# Patient Record
Sex: Female | Born: 1950
Health system: Southern US, Community
[De-identification: ages and names within clinical notes are randomized; demographics above are authoritative.]

## PROBLEM LIST (undated history)

## (undated) DIAGNOSIS — Z87440 Personal history of urinary (tract) infections: Secondary | ICD-10-CM

## (undated) DIAGNOSIS — E213 Hyperparathyroidism, unspecified: Secondary | ICD-10-CM

## (undated) DIAGNOSIS — Z9289 Personal history of other medical treatment: Secondary | ICD-10-CM

## (undated) DIAGNOSIS — R519 Headache, unspecified: Secondary | ICD-10-CM

## (undated) DIAGNOSIS — Z803 Family history of malignant neoplasm of breast: Secondary | ICD-10-CM

## (undated) DIAGNOSIS — R51 Headache: Secondary | ICD-10-CM

## (undated) HISTORY — PX: ABDOMINAL HYSTERECTOMY: SHX81

## (undated) HISTORY — DX: Family history of malignant neoplasm of breast: Z80.3

## (undated) HISTORY — DX: Personal history of other medical treatment: Z92.89

## (undated) HISTORY — PX: APPENDECTOMY: SHX54

## (undated) HISTORY — DX: Hyperparathyroidism, unspecified: E21.3

## (undated) HISTORY — DX: Personal history of urinary (tract) infections: Z87.440

## (undated) HISTORY — DX: Headache, unspecified: R51.9

## (undated) HISTORY — PX: PARATHYROID EXPLORATION: SHX732

## (undated) HISTORY — DX: Headache: R51

---

## 1999-09-18 ENCOUNTER — Other Ambulatory Visit: Admission: RE | Admit: 1999-09-18 | Discharge: 1999-09-18 | Payer: Self-pay | Admitting: Obstetrics and Gynecology

## 2000-09-12 ENCOUNTER — Other Ambulatory Visit: Admission: RE | Admit: 2000-09-12 | Discharge: 2000-09-12 | Payer: Self-pay | Admitting: Obstetrics and Gynecology

## 2008-06-13 ENCOUNTER — Ambulatory Visit: Payer: Self-pay | Admitting: Genetic Counselor

## 2008-09-13 ENCOUNTER — Encounter: Admission: RE | Admit: 2008-09-13 | Discharge: 2008-09-13 | Payer: Self-pay | Admitting: Family Medicine

## 2009-09-18 ENCOUNTER — Encounter: Admission: RE | Admit: 2009-09-18 | Discharge: 2009-09-18 | Payer: Self-pay | Admitting: Family Medicine

## 2011-02-04 LAB — HM COLONOSCOPY

## 2014-08-17 DIAGNOSIS — H2513 Age-related nuclear cataract, bilateral: Secondary | ICD-10-CM | POA: Diagnosis not present

## 2014-09-14 DIAGNOSIS — M25532 Pain in left wrist: Secondary | ICD-10-CM | POA: Diagnosis not present

## 2014-09-14 DIAGNOSIS — M65842 Other synovitis and tenosynovitis, left hand: Secondary | ICD-10-CM | POA: Diagnosis not present

## 2014-09-28 DIAGNOSIS — M25532 Pain in left wrist: Secondary | ICD-10-CM | POA: Diagnosis not present

## 2014-09-28 DIAGNOSIS — M654 Radial styloid tenosynovitis [de Quervain]: Secondary | ICD-10-CM | POA: Diagnosis not present

## 2014-11-03 DIAGNOSIS — M19032 Primary osteoarthritis, left wrist: Secondary | ICD-10-CM | POA: Diagnosis not present

## 2014-11-03 DIAGNOSIS — M25532 Pain in left wrist: Secondary | ICD-10-CM | POA: Diagnosis not present

## 2014-11-24 DIAGNOSIS — M19032 Primary osteoarthritis, left wrist: Secondary | ICD-10-CM | POA: Diagnosis not present

## 2014-11-24 DIAGNOSIS — M25532 Pain in left wrist: Secondary | ICD-10-CM | POA: Diagnosis not present

## 2015-01-11 DIAGNOSIS — Z01419 Encounter for gynecological examination (general) (routine) without abnormal findings: Secondary | ICD-10-CM | POA: Diagnosis not present

## 2015-01-11 DIAGNOSIS — Z1231 Encounter for screening mammogram for malignant neoplasm of breast: Secondary | ICD-10-CM | POA: Diagnosis not present

## 2015-08-10 ENCOUNTER — Encounter: Payer: Self-pay | Admitting: Family Medicine

## 2015-08-10 ENCOUNTER — Ambulatory Visit (INDEPENDENT_AMBULATORY_CARE_PROVIDER_SITE_OTHER): Payer: Medicare Other | Admitting: Family Medicine

## 2015-08-10 VITALS — BP 100/58 | HR 86 | Temp 98.6°F | Ht 65.75 in | Wt 126.5 lb

## 2015-08-10 DIAGNOSIS — E559 Vitamin D deficiency, unspecified: Secondary | ICD-10-CM

## 2015-08-10 DIAGNOSIS — Z0000 Encounter for general adult medical examination without abnormal findings: Secondary | ICD-10-CM | POA: Diagnosis not present

## 2015-08-10 DIAGNOSIS — E785 Hyperlipidemia, unspecified: Secondary | ICD-10-CM | POA: Insufficient documentation

## 2015-08-10 LAB — COMPLETE METABOLIC PANEL WITH GFR
ALBUMIN: 4.5 g/dL (ref 3.6–5.1)
ALK PHOS: 46 U/L (ref 33–130)
ALT: 13 U/L (ref 6–29)
AST: 17 U/L (ref 10–35)
BUN: 14 mg/dL (ref 7–25)
CALCIUM: 9.3 mg/dL (ref 8.6–10.4)
CHLORIDE: 105 mmol/L (ref 98–110)
CO2: 25 mmol/L (ref 20–31)
Creat: 0.73 mg/dL (ref 0.50–0.99)
GFR, EST NON AFRICAN AMERICAN: 85 mL/min (ref >=60)
GFR, Est African American: >89 mL/min (ref >=60)
Glucose, Bld: 88 mg/dL (ref 65–99)
POTASSIUM: 4 mmol/L (ref 3.5–5.3)
SODIUM: 140 mmol/L (ref 135–146)
Total Bilirubin: 0.7 mg/dL (ref 0.2–1.2)
Total Protein: 6.8 g/dL (ref 6.1–8.1)

## 2015-08-10 LAB — LIPID PANEL
CHOL/HDL RATIO: 3
CHOLESTEROL: 218 mg/dL — AB (ref 0–200)
HDL: 65.9 mg/dL (ref >39.00)
LDL CALC: 132 mg/dL — AB (ref 0–99)
NonHDL: 152.27
TRIGLYCERIDES: 103 mg/dL (ref 0.0–149.0)
VLDL: 20.6 mg/dL (ref 0.0–40.0)

## 2015-08-10 LAB — VITAMIN D 25 HYDROXY (VIT D DEFICIENCY, FRACTURES): VITD: 67.37 ng/mL (ref 30.00–100.00)

## 2015-08-10 NOTE — Progress Notes (Signed)
HPI:  Andrea Gardner is here to establish care. She has not had a primary care doctor as has been seeing her gynecologist. Lives in [place] now and sees Dr. Garnet Koyanagi in gyn in [place], [place] yearly. History of complete hysterectomy for endometriosis and fibroids. She takes a number of supplements.  Has the following chronic problems that require follow up and concerns today:  Hyperlipidemia: -mild in the past -no medications in the past -regular aerobic exercise - only meat is fish and venison, healthy diet  History of Hyperparathyroidism: -dx in 2013 after kidney stone -s/p surgical treatment -done fine since and released from endocrine care -denies: pain, illness or stones since treatment  Vit Def: -takes vit D3 5000 IU  ROS negative for unless reported above: fevers, unintentional weight loss, hearing or vision loss, chest pain, palpitations, struggling to breath, hemoptysis, melena, hematochezia, hematuria, falls, loc, si, thoughts of self harm  Past Medical History  Diagnosis Date  . Frequent headaches   . History of blood transfusion   . History of UTI   . Hyperparathyroidism [facility])     s/p surgical treatment in 2013  . Family history of breast cancer     she reports she had genetic testing due to [place] and that was all negative    Past Surgical History  Procedure Laterality Date  . Abdominal hysterectomy      complete for endometriosis and fibroids  . Parathyroid exploration    . Appendectomy      w/ hysterectomy    Family History  Problem Relation Age of Onset  . Arthritis Father   . Breast cancer Mother   . Breast cancer Sister   . Breast cancer Maternal Aunt   . Hyperlipidemia Father   . Li-Fraumeni syndrome Sister   . Hypertension Father   . Heart disease Father     Social History   Social History  . Marital Status: Single    Spouse Name: N/A  . Number of Children: N/A  . Years of Education: N/A   Social History Main Topics  .  Smoking status: Former Research scientist (life sciences)  . Smokeless tobacco: None  . Alcohol Use: None     Comment: 1 glass of wine daily  . Drug Use: None  . Sexual Activity: Not Asked   Other Topics Concern  . None   Social History Narrative   Work or School: retired Forensic psychologist Situation: lives with husband      Spiritual Beliefs: Christian      Lifestyle: regular exercise; healthy diet           Current outpatient prescriptions:  .  Ascorbic Acid (VITAMIN C) 1000 MG tablet, Take 1,000 mg by mouth daily., Disp: , Rfl:  .  Biotin 5000 MCG CAPS, Take by mouth daily., Disp: , Rfl:  .  Calcium-Phosphorus-Vitamin D (CITRACAL +D3 PO), Take 630 mg by mouth daily., Disp: , Rfl:  .  Cholecalciferol (VITAMIN D-3 PO), Take 5,000 Units by mouth daily., Disp: , Rfl:  .  magnesium oxide (MAG-OX) 400 MG tablet, Take 400 mg by mouth 2 (two) times daily., Disp: , Rfl:  .  NON FORMULARY, Nutra-therm, Disp: , Rfl:  .  NON FORMULARY, Stimulant free fat burner, Disp: , Rfl:  .  Nutritional Supplements (ESTROVEN PO), Take by mouth., Disp: , Rfl:  .  Probiotic Product (FORTIFY DAILY PROBIOTIC PO), Take by mouth., Disp: , Rfl:   EXAM:  Filed Vitals:   08/10/15  1308  BP: 100/58  Pulse: 86  Temp: 98.6 F (37 C)    Body mass index is 20.57 kg/(m^2).  GENERAL: vitals reviewed and listed above, alert, oriented, appears well hydrated and in no acute distress  HEENT: atraumatic, conjunttiva clear, no obvious abnormalities on inspection of external nose and ears  NECK: no obvious masses on inspection  LUNGS: clear to auscultation bilaterally, no wheezes, rales or rhonchi, good air movement  CV: HRRR, no peripheral edema  MS: moves all extremities without noticeable abnormality  PSYCH: pleasant and cooperative, no obvious depression or anxiety  ASSESSMENT AND PLAN:  Discussed the following assessment and plan:  Visit for preventive health examination - Plan: CMP with eGFR  Hyperlipemia - Plan:  Lipid panel  Vitamin D deficiency - Plan: Vitamin D, 25-hydroxy  -We reviewed the PMH, PSH, FH, SH, Meds and Allergies. -We provided refills for any medications we will prescribe as needed. -We addressed current concerns per orders and patient instructions. -We have asked for records for pertinent exams, studies, vaccines and notes from previous providers. -We have advised patient to follow up per instructions below. -labs per orders -Discussed risks of supplements and advised checking calcium and vitamin D and only taking vitamins as needed -Advised assistant to contact cancer center to try to obtain office visit notes from prior genetic testing and counseling   -Patient advised to return or notify a doctor immediately if symptoms worsen or persist or new concerns arise.  Patient Instructions  BEFORE YOU LEAVE: -labs -schedule physical in 6 months  We recommend the following healthy lifestyle measures: - eat a healthy whole foods diet consisting of regular small meals composed of vegetables, fruits, beans, nuts, seeds, healthy meats such as white chicken and fish and whole grains.  - avoid sweets, white starchy foods, fried foods, fast food, processed foods, sodas, red meet and other fattening foods.  - get a least 150-300 minutes of aerobic exercise per week.   -We have ordered labs or studies at this visit. It can take up to 1-2 weeks for results and processing. We will contact you with instructions IF your results are abnormal. Normal results will be released to your [facility]. If you have not heard from [place] or can not find your results in [facility] in 2 weeks please contact our office.            Colin Benton R.

## 2015-08-10 NOTE — Progress Notes (Signed)
Pre visit review using our clinic review tool, if applicable. No additional management support is needed unless otherwise documented below in the visit note. 

## 2015-08-10 NOTE — Patient Instructions (Signed)
BEFORE YOU LEAVE: -labs -schedule physical in 6 months  We recommend the following healthy lifestyle measures: - eat a healthy whole foods diet consisting of regular small meals composed of vegetables, fruits, beans, nuts, seeds, healthy meats such as white chicken and fish and whole grains.  - avoid sweets, white starchy foods, fried foods, fast food, processed foods, sodas, red meet and other fattening foods.  - get a least 150-300 minutes of aerobic exercise per week.   -We have ordered labs or studies at this visit. It can take up to 1-2 weeks for results and processing. We will contact you with instructions IF your results are abnormal. Normal results will be released to your [facility]. If you have not heard from us or can not find your results in [facility] in 2 weeks please contact our office.

## 2015-11-20 ENCOUNTER — Encounter: Payer: Self-pay | Admitting: Family Medicine

## 2015-12-26 DIAGNOSIS — B308 Other viral conjunctivitis: Secondary | ICD-10-CM | POA: Diagnosis not present

## 2016-02-06 ENCOUNTER — Ambulatory Visit (INDEPENDENT_AMBULATORY_CARE_PROVIDER_SITE_OTHER): Payer: Medicare Other | Admitting: Family Medicine

## 2016-02-06 ENCOUNTER — Encounter: Payer: Self-pay | Admitting: Family Medicine

## 2016-02-06 VITALS — BP 110/78 | HR 74 | Temp 98.0°F | Ht 65.75 in | Wt 121.2 lb

## 2016-02-06 DIAGNOSIS — Z0000 Encounter for general adult medical examination without abnormal findings: Secondary | ICD-10-CM

## 2016-02-06 DIAGNOSIS — E785 Hyperlipidemia, unspecified: Secondary | ICD-10-CM | POA: Diagnosis not present

## 2016-02-06 NOTE — Progress Notes (Signed)
Medicare Annual Preventive Care Visit  (initial annual wellness or annual wellness exam)  Concerns and/or follow up today:  Andrea Gardner is here for her preventive care visit. She lives in [place] now and sees Dr. Leontine Locket in gyn in [place], [place] yearly. History of complete hysterectomy for endometriosis and fibroids. She takes a number of supplements. She has decided that moving forward she will no longer see gyn.   Has the following chronic problems that require follow up and concerns/chronic issues to follow up on today:  Hyperlipidemia: -mild in the past -no medications in the past -regular aerobic exercise - only meat is fish and venison, healthy diet - has increased exercise since the last visit  History of Hyperparathyroidism: -dx in 2013 after kidney stone -s/p surgical treatment -done fine since and released from endocrine care -denies: pain, illness or stones since treatment  Vit Def: -takes vit D3 5000 IU  Depression screen - she reported occ depressed mood to nurse - to me she said not depressed - just sometimes doesn't like the weather - happy, no anhedonia, no crying, no irritability or any other signs/symptoms depression. No SI or thoughts self harm.  ROS: negative for report of fevers, unintentional weight loss, vision changes, vision loss, hearing loss or change, chest pain, sob, hemoptysis, melena, hematochezia, hematuria, genital discharge or lesions, falls, bleeding or bruising, loc, thoughts of suicide or self harm, memory loss  1.) Patient-completed health risk assessment  - completed and reviewed, see scanned documentation  2.) Review of Medical History: -PMH, PSH, Family History and current specialty and care providers reviewed and updated and listed below  - see scanned in document in chart and below  Past Medical History:  Diagnosis Date  . Family history of breast cancer    FH Burgess Amor; her genetic testing was negative for this mutation  .  Frequent headaches   . History of blood transfusion   . History of UTI   . Hyperparathyroidism [facility])    s/p surgical treatment in 2013    Past Surgical History:  Procedure Laterality Date  . ABDOMINAL HYSTERECTOMY     complete for endometriosis and fibroids  . APPENDECTOMY     w/ hysterectomy  . PARATHYROID EXPLORATION      Social History   Social History  . Marital status: Married    Spouse name: N/A  . Number of children: N/A  . Years of education: N/A   Occupational History  . Not on file.   Social History Main Topics  . Smoking status: Former Games developer  . Smokeless tobacco: Not on file  . Alcohol use Not on file     Comment: 1 glass of wine daily  . Drug use: Unknown  . Sexual activity: Not on file   Other Topics Concern  . Not on file   Social History Narrative   Work or School: retired [employer] Situation: lives with husband      Spiritual Beliefs: Christian      Lifestyle: regular exercise; healthy diet          Family History  Problem Relation Age of Onset  . Arthritis Father   . Breast cancer Mother   . Breast cancer Sister   . Breast cancer Maternal Aunt   . Hyperlipidemia Father   . Li-Fraumeni syndrome Sister   . Hypertension Father   . Heart disease Father     Current Outpatient Prescriptions on File Prior to Visit  Medication  Sig Dispense Refill  . Ascorbic Acid (VITAMIN C) 1000 MG tablet Take 1,000 mg by mouth daily.    . Calcium-Phosphorus-Vitamin D (CITRACAL +D3 PO) Take 250 mg by mouth daily.     . Cholecalciferol (VITAMIN D-3 PO) Take 5,000 Units by mouth daily.    . magnesium oxide (MAG-OX) 400 MG tablet Take 400 mg by mouth 2 (two) times daily.    . NON FORMULARY Stimulant free fat burner    . Nutritional Supplements (ESTROVEN PO) Take by mouth.    . Probiotic Product (FORTIFY DAILY PROBIOTIC PO) Take by mouth.     No current facility-administered medications on file prior to visit.      3.) Review of functional  ability and level of safety:  Any difficulty hearing?  NO  History of falling?  NO  Any trouble with IADLs - using a phone, using transportation, grocery shopping, preparing meals, doing housework, doing laundry, taking medications and managing money? NO  Advance Directives? NO, declined discussion or assistance with this as she plans to do with a lawyer with her husband  See summary of recommendations in Patient Instructions below.  4.) Physical Exam Vitals:   02/06/16 0807  BP: 110/78  Pulse: 74  Temp: 98 F (36.7 C)   Estimated body mass index is 19.71 kg/m as calculated from the following:   Height as of this encounter: 5' 5.75" (1.67 m).   Weight as of this encounter: 121 lb 3.2 oz (55 kg).  EKG (optional): deferred  General: alert, appear well hydrated and in no acute distress  HEENT: visual acuity grossly intact  CV: HRRR  Lungs: CTA bilaterally  Psych: pleasant and cooperative, no obvious depression or anxiety  Cognitive function grossly intact  See patient instructions for recommendations.  Education and counseling regarding the above review of health provided with a plan for the following: -see scanned patient completed form for further details -fall prevention strategies discussed  -healthy lifestyle discussed -importance and resources for completing advanced directives discussed -see patient instructions below for any other recommendations provided  4)The following written screening schedule of preventive measures were reviewed with assessment and plan made per below, orders and patient instructions:         Alcohol screening done     Obesity Screening and counseling done     STI screening (Hep C if born [id number]) offered and per pt wishes declined     Tobacco Screening done        Pneumococcal (PPSV23 -one dose after 64, one before if risk factors), influenza yearly and hepatitis B vaccines (if high risk - end stage renal disease, IV drugs,  homosexual men, live in home for mentally retarded, hemophilia receiving factors) ASSESSMENT/PLAN: refuses all vaccines      Screening mammograph (yearly if >40) ASSESSMENT/PLAN: advised, she agrees to call to schedule herself at the breast center      Screening Pap smear/pelvic exam (q2 years) ASSESSMENT/PLAN: n/a, declined      Prostate cancer screening ASSESSMENT/PLAN: n/a, declined      Colorectal cancer screening (FOBT yearly or flex sig q4y or colonoscopy q10y or barium enema q4y) ASSESSMENT/PLAN: utd -advised assistant to obtain copy for our records      Diabetes outpatient self-management training services ASSESSMENT/PLAN: utd or done      Bone mass measurements(covered q2y if indicated - estrogen def, osteoporosis, hyperparathyroid, vertebral abnormalities, osteoporosis or steroids) ASSESSMENT/PLAN: discussed and advised, pt refused, she took fosomax for osteopenia briefly in the past and  has decided she will not take any medications for this regardless of degree - she is taking vit D and is doing wt bearing exercises. Refuses repeat dexa at this time.      Screening for glaucoma(q1y if high risk - diabetes, FH, AA and > 50 or hispanic and > 65) ASSESSMENT/PLAN: utd or advised      Medical nutritional therapy for individuals with diabetes or renal disease ASSESSMENT/PLAN: n/a      Cardiovascular screening blood tests (lipids q5y) ASSESSMENT/PLAN: done      Diabetes screening tests ASSESSMENT/PLAN: done   7.) Summary: -risk factors and conditions per above assessment were discussed and treatment, recommendations and referrals were offered per documentation above and orders and patient instructions.  Medicare annual wellness visit, subsequent  Hyperlipidemia, unspecified hyperlipidemia type  Patient Instructions  BEFORE YOU LEAVE: -follow up: Medicare wellness visit in 1 year -obtain colonoscopy report (did at Dr. Aleene Davidson office in [place] in 2012 , doc now  retired, [phone])  Please call today to schedule your mammogram at the breast center.  Please see a lawyer and/or go to this website to help you with advanced directives and designating a health care power of attorney so that your wishes will be followed should you become too ill to make your own medical decisions. [email or web address]   We recommend the following healthy lifestyle for LIFE: 1) Small portions.   Tip: eat off of a salad plate instead of a dinner plate.  Tip: It is ok to feel hungry after a meal - that likely means you ate an appropriate portion.  Tip: if you need more or a snack choose fruits, veggies and/or a handful of nuts or seeds.  2) Eat a healthy clean diet.  * Tip: Avoid (less then 1 serving per week): processed foods, sweets, sweetened drinks, white starches (rice, flour, bread, potatoes, pasta, etc), red meat, fast foods, butter  *Tip: CHOOSE instead   * 5-9 servings per day of fresh or frozen fruits and vegetables (but not corn, potatoes, bananas, canned or dried fruit)   *nuts and seeds, beans   *olives and olive oil   *small portions of lean meats such as fish and white chicken    *small portions of whole grains  3)Get at least 150 minutes of sweaty aerobic exercise per week.  4)Reduce stress - consider counseling, meditation and relaxation to balance other aspects of your life.      Kriste Basque R., DO

## 2016-02-06 NOTE — Patient Instructions (Signed)
BEFORE YOU LEAVE: -follow up: Medicare wellness visit in 1 year -obtain colonoscopy report (did at [place]. [place]ene [place] in [place] in 2012 , doc now retired, [phone])  Please call today to schedule your mammogram at the breast center.  Please see a lawyer and/or go to this website to help you with advanced directives and designating a health care power of attorney so that your wishes will be followed should you become too ill to make your own medical decisions. AffordableReports.glHttp://www.ncdhhs.gov/aging/direct.htm   We recommend the following healthy lifestyle for LIFE: 1) Small portions.   Tip: eat off of a salad plate instead of a dinner plate.  Tip: It is ok to feel hungry after a meal - that likely means you ate an appropriate portion.  Tip: if you need more or a snack choose fruits, veggies and/or a handful of nuts or seeds.  2) Eat a healthy clean diet.  * Tip: Avoid (less then 1 serving per week): processed foods, sweets, sweetened drinks, white starches (rice, flour, bread, potatoes, pasta, etc), red meat, fast foods, butter  *Tip: CHOOSE instead   * 5-9 servings per day of fresh or frozen fruits and vegetables (but not corn, potatoes, bananas, canned or dried fruit)   *nuts and seeds, beans   *olives and olive oil   *small portions of lean meats such as fish and white chicken    *small portions of whole grains  3)Get at least 150 minutes of sweaty aerobic exercise per week.  4)Reduce stress - consider counseling, meditation and relaxation to balance other aspects of your life.

## 2016-02-06 NOTE — Progress Notes (Signed)
Pre visit review using our clinic review tool, if applicable. No additional management support is needed unless otherwise documented below in the visit note. 

## 2016-02-12 ENCOUNTER — Other Ambulatory Visit: Payer: Self-pay | Admitting: Family Medicine

## 2016-02-12 ENCOUNTER — Telehealth: Payer: Self-pay | Admitting: Family Medicine

## 2016-02-12 DIAGNOSIS — Z1231 Encounter for screening mammogram for malignant neoplasm of breast: Secondary | ICD-10-CM

## 2016-02-12 MED ORDER — ESTRADIOL 0.05 MG/24HR TD PTWK: 0.0500 mg | MEDICATED_PATCH | TRANSDERMAL | 11 refills | Status: DC

## 2016-02-12 NOTE — Telephone Encounter (Signed)
Pt request refill  estradiol (CLIMARA - DOSED IN MG/24 HR) 0.05 mg/24hr patch  [facility]  Pt seen 10/10 and thought this rx was refilled then.

## 2016-02-12 NOTE — Telephone Encounter (Signed)
Rx done. 

## 2016-03-01 ENCOUNTER — Ambulatory Visit
Admission: RE | Admit: 2016-03-01 | Discharge: 2016-03-01 | Disposition: A | Discharge status: Home / Self Care | Payer: Medicare Other | Source: Ambulatory Visit | Attending: Family Medicine | Admitting: Family Medicine

## 2016-03-01 DIAGNOSIS — Z1231 Encounter for screening mammogram for malignant neoplasm of breast: Secondary | ICD-10-CM | POA: Diagnosis not present

## 2016-03-06 ENCOUNTER — Other Ambulatory Visit: Payer: Self-pay | Admitting: Family Medicine

## 2016-03-06 DIAGNOSIS — R928 Other abnormal and inconclusive findings on diagnostic imaging of breast: Secondary | ICD-10-CM

## 2016-03-12 ENCOUNTER — Ambulatory Visit
Admission: RE | Admit: 2016-03-12 | Discharge: 2016-03-12 | Disposition: A | Discharge status: Home / Self Care | Payer: Medicare Other | Source: Ambulatory Visit | Attending: Family Medicine | Admitting: Family Medicine

## 2016-03-12 DIAGNOSIS — R928 Other abnormal and inconclusive findings on diagnostic imaging of breast: Secondary | ICD-10-CM

## 2016-03-12 DIAGNOSIS — N6311 Unspecified lump in the right breast, upper outer quadrant: Secondary | ICD-10-CM | POA: Diagnosis not present

## 2016-03-12 DIAGNOSIS — R922 Inconclusive mammogram: Secondary | ICD-10-CM | POA: Diagnosis not present

## 2016-07-15 ENCOUNTER — Encounter: Payer: Self-pay | Admitting: Family Medicine

## 2016-07-15 ENCOUNTER — Ambulatory Visit (INDEPENDENT_AMBULATORY_CARE_PROVIDER_SITE_OTHER): Payer: Medicare Other | Admitting: Family Medicine

## 2016-07-15 VITALS — BP 122/80 | HR 80 | Temp 98.3°F | Ht 65.75 in | Wt 126.9 lb

## 2016-07-15 DIAGNOSIS — M79604 Pain in right leg: Secondary | ICD-10-CM | POA: Diagnosis not present

## 2016-07-15 NOTE — Progress Notes (Signed)
Pre visit review using our clinic review tool, if applicable. No additional management support is needed unless otherwise documented below in the visit note. 

## 2016-07-15 NOTE — Progress Notes (Signed)
HPI:  Acute visit for:  R medial thigh pain: -intermittent for maybe 1-2 months -feels worse with sitting for a long time, better with walking -no bad enough to taking anything for it or limiting activities -she is worried about a blood clot -no weakness, numbness, malaise, known injury, SOB, swelling or redness -her massage therapist told her it was muscular and she felt better after a massage  ROS: See pertinent positives and negatives per HPI.  Past Medical History:  Diagnosis Date  . Family history of breast cancer    FH Burgess Amor; her genetic testing was negative for this mutation  . Frequent headaches   . History of blood transfusion   . History of UTI   . Hyperparathyroidism [facility])    s/p surgical treatment in 2013    Past Surgical History:  Procedure Laterality Date  . ABDOMINAL HYSTERECTOMY     complete for endometriosis and fibroids  . APPENDECTOMY     w/ hysterectomy  . PARATHYROID EXPLORATION      Family History  Problem Relation Age of Onset  . Arthritis Father   . Breast cancer Mother   . Breast cancer Sister   . Breast cancer Maternal Aunt   . Hyperlipidemia Father   . Li-Fraumeni syndrome Sister   . Hypertension Father   . Heart disease Father     Social History   Social History  . Marital status: Married    Spouse name: N/A  . Number of children: N/A  . Years of education: N/A   Social History Main Topics  . Smoking status: Former Games developer  . Smokeless tobacco: Never Used  . Alcohol use None     Comment: 1 glass of wine daily  . Drug use: Unknown  . Sexual activity: Not Asked   Other Topics Concern  . 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  Ascorbic Acid (VITAMIN C) 1000 MG tablet, Take 1,000 mg by mouth daily., Disp: , Rfl:   .  BIOTIN PO, Take 100,000 mg by mouth daily., Disp: , Rfl:  .  Calcium-Phosphorus-Vitamin D (CITRACAL +D3 PO), Take 250 mg by mouth daily. , Disp: , Rfl:  .  Cholecalciferol (VITAMIN D-3 PO), Take 5,000 Units by mouth daily., Disp: , Rfl:  .  estradiol (CLIMARA - DOSED IN MG/24 HR) 0.05 mg/24hr patch, Place 1 patch (0.05 mg total) onto the skin once a week., Disp: 4 patch, Rfl: 11 .  magnesium oxide (MAG-OX) 400 MG tablet, Take 400 mg by mouth 2 (two) times daily., Disp: , Rfl:  .  NON FORMULARY, Stimulant free fat burner, Disp: , Rfl:  .  Nutritional Supplements (ESTROVEN PO), Take by mouth., Disp: , Rfl:  .  OVER THE COUNTER MEDICATION, Supplement with vitamin B6, B12 and folate, Disp: , Rfl:  .  Probiotic Product (FORTIFY DAILY PROBIOTIC PO), Take by mouth., Disp: , Rfl:  .  Probiotic Product (PROBIOTIC PO), Take by mouth., Disp: , Rfl:   EXAM:  Vitals:   07/15/16 0953  BP: 122/80  Pulse: 80  Temp: 98.3 F (36.8 C)    Body mass index is 20.64 kg/m.  GENERAL: vitals reviewed and listed above, alert, oriented, appears well hydrated and in  no acute distress  HEENT: atraumatic, conjunttiva clear, no obvious abnormalities on inspection of external nose and ears  NECK: no obvious masses on inspection  CV: no signs swelling, redness or edema of LE, no TTP of superficial or deep veins of legs  MS: moves all extremities without noticeable abnormality, normal gait, no redness, swelling or abnormal veins, mild TTP leg adductor tendons -reproduces symptoms, normal strength, sensitivity to light touch throughout in LE bilat, NV intact distal  PSYCH: pleasant and cooperative, no obvious depression or anxiety  ASSESSMENT AND PLAN:  Discussed the following assessment and plan:  Pain of right lower extremity  -we discussed possible serious and likely etiologies, workup and treatment, treatment risks and return precautions - suspect tendinopathy vs radicular symptoms most likely -after  this discussion, Drinda Buttsnnette opted for HEP, exercise, monitoring and follow up if needed -of course, we advised Drinda Buttsnnette  to return or notify a doctor immediately if symptoms worsen or persist or new concerns arise.   Patient Instructions  BEFORE YOU LEAVE: -let adductor exercises and low back exercises -follow up: ensure has annual exam with Dr. Selena BattenKim in October of 2018  Regular exercise.  Do the exercise programs provided several days per week.  I hope you are feeling better soon! Follow up if worsening, new concerns or you are not improving with treatment.      Kriste BasqueKIM, Dezmon Conover R., DO

## 2016-07-15 NOTE — Patient Instructions (Addendum)
BEFORE YOU LEAVE: -let adductor exercises and low back exercises -follow up: ensure has annual exam with Dr. Selena BattenKim in October of 2018  Regular exercise.  Do the exercise programs provided several days per week.  I hope you are feeling better soon! Follow up if worsening, new concerns or you are not improving with treatment.

## 2016-08-21 DIAGNOSIS — H2513 Age-related nuclear cataract, bilateral: Secondary | ICD-10-CM | POA: Diagnosis not present

## 2017-01-06 DIAGNOSIS — M9904 Segmental and somatic dysfunction of sacral region: Secondary | ICD-10-CM | POA: Diagnosis not present

## 2017-01-06 DIAGNOSIS — M545 Low back pain: Secondary | ICD-10-CM | POA: Diagnosis not present

## 2017-01-06 DIAGNOSIS — M9903 Segmental and somatic dysfunction of lumbar region: Secondary | ICD-10-CM | POA: Diagnosis not present

## 2017-01-06 DIAGNOSIS — M9902 Segmental and somatic dysfunction of thoracic region: Secondary | ICD-10-CM | POA: Diagnosis not present

## 2017-01-16 ENCOUNTER — Encounter: Payer: Self-pay | Admitting: Family Medicine

## 2017-02-05 NOTE — Progress Notes (Signed)
Medicare Annual Preventive Care Visit  (initial annual wellness or annual wellness exam)  Concerns and/or follow up today: Lives in [place]. Used to see gyn yearly there wants to see a different gynecologist - liked Sherolyn Buba and would like to see her if she is available - on google she is at [facility] and she may call there for appt with her or another provider. Past due for labs, breast center follow up, ? Colon ca screening, ? dexa (refused before) Agrees to labs Refuses dexa. Reports colonoscopy normal and told repeat in 10 years in 2012 She agrees to call breast center for follow up Refused vaccines last year. Not on any opioid pain medications.  Hyperlipidemia: -mild in the past -no medications in the past -regular aerobic exercise - only meat is fish and venison, healthy diet   History of Hyperparathyroidism: -dx in 2013 after kidney stone -s/p surgical treatment -done fine since and released from endocrine care -denies: pain, illness or stones since treatment  Vit Def: -takes vit D3 5000 IU  Hot flases: -on HRT with gyn, climara, no uterus or ovaries per pt  See HM section in Epic for other details of completed HM. See scanned documentation under Media Tab for further documentation HPI, health risk assessment. See Media Tab and Care Teams sections in Epic for other providers.  ROS: negative for report of fevers, unintentional weight loss, vision changes, vision loss, hearing loss or change, chest pain, sob, hemoptysis, melena, hematochezia, hematuria, genital discharge or lesions, falls, bleeding or bruising, loc, thoughts of suicide or self harm, memory loss  1.) Patient-completed health risk assessment  - completed and reviewed, see scanned documentation  2.) Review of Medical History: -PMH, PSH, Family History and current specialty and care providers reviewed and updated and listed below  - see scanned in document in chart and below  Past Medical History:    Diagnosis Date  . Family history of breast cancer    FH Maylon Peppers; her genetic testing was negative for this mutation  . Frequent headaches   . History of blood transfusion   . History of UTI   . Hyperparathyroidism [facility])    s/p surgical treatment in 2013    Past Surgical History:  Procedure Laterality Date  . ABDOMINAL HYSTERECTOMY     complete for endometriosis and fibroids  . APPENDECTOMY     w/ hysterectomy  . PARATHYROID EXPLORATION      Social History   Social History  . Marital status: Married    Spouse name: N/A  . Number of children: N/A  . Years of education: N/A   Occupational History  . Not on file.   Social History Main Topics  . Smoking status: Former Research scientist (life sciences)  . Smokeless tobacco: Never Used  . Alcohol use Not on file     Comment: 1 glass of wine daily  . Drug use: Unknown  . Sexual activity: Not on file   Other Topics Concern  . Not on file   Social History Narrative   Work or School: retired [employer] Situation: lives with husband      Spiritual Beliefs: Christian      Lifestyle: regular exercise; healthy diet          Family History  Problem Relation Age of Onset  . Arthritis Father   . Breast cancer Mother   . Breast cancer Sister   . Breast cancer Maternal Aunt   . Hyperlipidemia Father   .  Li-Fraumeni syndrome Sister   . Hypertension Father   . Heart disease Father     Current Outpatient Prescriptions on File Prior to Visit  Medication Sig Dispense Refill  . Ascorbic Acid (VITAMIN C) 1000 MG tablet Take 1,000 mg by mouth daily.    Marland Kitchen BIOTIN PO Take 100,000 mg by mouth daily.    . Calcium-Phosphorus-Vitamin D (CITRACAL +D3 PO) Take 250 mg by mouth daily.     . Cholecalciferol (VITAMIN D-3 PO) Take 5,000 Units by mouth daily.    Marland Kitchen estradiol (CLIMARA - DOSED IN MG/24 HR) 0.05 mg/24hr patch Place 1 patch (0.05 mg total) onto the skin once a week. 4 patch 11  . magnesium oxide (MAG-OX) 400 MG tablet Take 400 mg by  mouth 2 (two) times daily.    Marland Kitchen OVER THE COUNTER MEDICATION Supplement with vitamin B6, B12 and folate    . Probiotic Product (PROBIOTIC PO) Take by mouth.     No current facility-administered medications on file prior to visit.      3.) Review of functional ability and level of safety:  Any difficulty hearing?  See scanned documentation  History of falling?  See scanned documentation  Any trouble with IADLs - using a phone, using transportation, grocery shopping, preparing meals, doing housework, doing laundry, taking medications and managing money?  See scanned documentation  Advance Directives?  Discussed briefly and offered more resources and detailed discussion with our trained staff. She will see Epifania Gore.  See summary of recommendations in Patient Instructions below.  4.) Physical Exam Vitals:   02/06/17 0801  BP: 100/80  Pulse: 77  Temp: 98.1 F (36.7 C)   Estimated body mass index is 19.85 kg/m as calculated from the following:   Height as of this encounter: 5' 6" (1.676 m).   Weight as of this encounter: 123 lb (55.8 kg).  EKG (optional): deferred  General: alert, appear well hydrated and in no acute distress  HEENT: visual acuity grossly intact  CV: HRRR  Lungs: CTA bilaterally  Psych: pleasant and cooperative, no obvious depression or anxiety  Cognitive function grossly intact  See patient instructions for recommendations.  Education and counseling regarding the above review of health provided with a plan for the following: -see scanned patient completed form for further details -fall prevention strategies discussed  -healthy lifestyle discussed -importance and resources for completing advanced directives discussed -see patient instructions below for any other recommendations provided  4)The following written screening schedule of preventive measures were reviewed with assessment and plan made per below, orders and patient instructions:        Alcohol screening done     Obesity Screening and counseling done     STI screening (Hep C if born 50-65) offered and per pt wishes     Tobacco Screening done done       Pneumococcal (PPSV23 -one dose after 64, one before if risk factors), influenza yearly and hepatitis B vaccines (if high risk - end stage renal disease, IV drugs, homosexual men, live in home for mentally retarded, hemophilia receiving factors) ASSESSMENT/PLAN: refuses vaccines      Screening mammograph (yearly if >40) ASSESSMENT/PLAN: 02/2016 --> R breast mass favored benign per review records from breast center but 6 mo f/u advised - she did not do the follow up      Screening Pap smear/pelvic exam (q2 years) ASSESSMENT/PLAN: did yearly with gyn and completed screening per her report      Colorectal cancer screening (FOBT yearly  or flex sig q4y or colonoscopy q10y or barium enema q4y) ASSESSMENT/PLAN: utd per pt       Diabetes outpatient self-management training services ASSESSMENT/PLAN: utd or done      Bone mass measurements(covered q2y if indicated - estrogen def, osteoporosis, hyperparathyroid, vertebral abnormalities, osteoporosis or steroids) ASSESSMENT/PLAN: refused repeat dexa,       Screening for glaucoma(q1y if high risk - diabetes, FH, AA and > 50 or hispanic and > 65) ASSESSMENT/PLAN: utd or advised      Medical nutritional therapy for individuals with diabetes or renal disease ASSESSMENT/PLAN: see orders      Cardiovascular screening blood tests (lipids q5y) ASSESSMENT/PLAN: see orders and labs      Diabetes screening tests ASSESSMENT/PLAN: see orders and labs   7.) Summary: -risk factors and conditions per above assessment were discussed and treatment, recommendations and referrals were offered per documentation above and orders and patient instructions.  Medicare annual wellness visit, subsequent  Hyperlipidemia, unspecified hyperlipidemia type - Plan: Lipid panel  Hx of  hyperparathyroidism - Plan: Basic metabolic panel  Hot flashes  History of abnormal mammogram  Encounter for hepatitis C virus screening test for high risk patient - Plan: Hepatitis C antibody  Patient Instructions   BEFORE YOU LEAVE: -labs first, then -Advanced directives with Susan -follow up: in 1 year for AWV with susan and follow up with Dr. Kim  Call the breast center today to schedule your follow up appointment.  Call [facility] for an appointment.  We have ordered labs or studies at this visit. It can take up to 1-2 weeks for results and processing. IF results require follow up or explanation, we will call you with instructions. Clinically stable results will be released to your MYCHART. If you have not heard from us or cannot find your results in MYCHART in 2 weeks please contact our office at [phone].  If you are not yet signed up for MYCHART, please consider signing up.   Ms. Spearman , Thank you for taking time to come for your Medicare Wellness Visit. I appreciate your ongoing commitment to your health goals. Please review the following plan we discussed and let me know if I can assist you in the future.   These are the goals we discussed: Goals    Repeat breast exam at breast center Regular weight bearing aerobic exercise Healthy diet Complete advanced directives      This is a list of the screening recommended for you and due dates:  Health Maintenance  Topic Date Due  . Flu Shot  Patient declined  .  Hepatitis C: One time screening is recommended by Center for Disease Control  (CDC) for  adults born from 1945 through 1965.   Ordered today with labs  . Pneumonia vaccines (1 of 2 - PCV13) Patient declined  . Mammogram  Patient to call to schedule  . Colon Cancer Screening  02/03/2021  . Tetanus Vaccine  Patient declined  . DEXA scan (bone density measurement)  Completed, declined repeat  *Topic was postponed. The date shown is not the original due  date.      Health Maintenance for Postmenopausal Women Menopause is a normal process in which your reproductive ability comes to an end. This process happens gradually over a span of months to years, usually between the ages of 48 and 55. Menopause is complete when you have missed 12 consecutive menstrual periods. It is important to talk with your health care provider about some of the   most common conditions that affect postmenopausal women, such as heart disease, cancer, and bone loss (osteoporosis). Adopting a healthy lifestyle and getting preventive care can help to promote your health and wellness. Those actions can also lower your chances of developing some of these common conditions. What should I know about menopause? During menopause, you may experience a number of symptoms, such as:  Moderate-to-severe hot flashes.  Night sweats.  Decrease in sex drive.  Mood swings.  Headaches.  Tiredness.  Irritability.  Memory problems.  Insomnia.  Choosing to treat or not to treat menopausal changes is an individual decision that you make with your health care provider. What should I know about hormone replacement therapy and supplements? Hormone therapy products are effective for treating symptoms that are associated with menopause, such as hot flashes and night sweats. Hormone replacement carries certain risks, especially as you become older. If you are thinking about using estrogen or estrogen with progestin treatments, discuss the benefits and risks with your health care provider. What should I know about heart disease and stroke? Heart disease, heart attack, and stroke become more likely as you age. This may be due, in part, to the hormonal changes that your body experiences during menopause. These can affect how your body processes dietary fats, triglycerides, and cholesterol. Heart attack and stroke are both medical emergencies. There are many things that you can do to help  prevent heart disease and stroke:  Have your blood pressure checked at least every 1-2 years. High blood pressure causes heart disease and increases the risk of stroke.  If you are 55-79 years old, ask your health care provider if you should take aspirin to prevent a heart attack or a stroke.  Do not use any tobacco products, including cigarettes, chewing tobacco, or electronic cigarettes. If you need help quitting, ask your health care provider.  It is important to eat a healthy diet and maintain a healthy weight. ? Be sure to include plenty of vegetables, fruits, low-fat dairy products, and lean protein. ? Avoid eating foods that are high in solid fats, added sugars, or salt (sodium).  Get regular exercise. This is one of the most important things that you can do for your health. ? Try to exercise for at least 150 minutes each week. The type of exercise that you do should increase your heart rate and make you sweat. This is known as moderate-intensity exercise. ? Try to do strengthening exercises at least twice each week. Do these in addition to the moderate-intensity exercise.  Know your numbers.Ask your health care provider to check your cholesterol and your blood glucose. Continue to have your blood tested as directed by your health care provider.  What should I know about cancer screening? There are several types of cancer. Take the following steps to reduce your risk and to catch any cancer development as early as possible. Breast Cancer  Practice breast self-awareness. ? This means understanding how your breasts normally appear and feel. ? It also means doing regular breast self-exams. Let your health care provider know about any changes, no matter how small.  If you are 40 or older, have a clinician do a breast exam (clinical breast exam or CBE) every year. Depending on your age, family history, and medical history, it may be recommended that you also have a yearly breast X-ray  (mammogram).  If you have a family history of breast cancer, talk with your health care provider about genetic screening.  If you are at   high risk for breast cancer, talk with your health care provider about having an MRI and a mammogram every year.  Breast cancer (BRCA) gene test is recommended for women who have family members with BRCA-related cancers. Results of the assessment will determine the need for genetic counseling and BRCA1 and for BRCA2 testing. BRCA-related cancers include these types: ? Breast. This occurs in males or females. ? Ovarian. ? Tubal. This may also be called fallopian tube cancer. ? Cancer of the abdominal or pelvic lining (peritoneal cancer). ? Prostate. ? Pancreatic.  Cervical, Uterine, and Ovarian Cancer Your health care provider may recommend that you be screened regularly for cancer of the pelvic organs. These include your ovaries, uterus, and vagina. This screening involves a pelvic exam, which includes checking for microscopic changes to the surface of your cervix (Pap test).  For women ages 21-65, health care providers may recommend a pelvic exam and a Pap test every three years. For women ages 30-65, they may recommend the Pap test and pelvic exam, combined with testing for human papilloma virus (HPV), every five years. Some types of HPV increase your risk of cervical cancer. Testing for HPV may also be done on women of any age who have unclear Pap test results.  Other health care providers may not recommend any screening for nonpregnant women who are considered low risk for pelvic cancer and have no symptoms. Ask your health care provider if a screening pelvic exam is right for you.  If you have had past treatment for cervical cancer or a condition that could lead to cancer, you need Pap tests and screening for cancer for at least 20 years after your treatment. If Pap tests have been discontinued for you, your risk factors (such as having a new sexual  partner) need to be reassessed to determine if you should start having screenings again. Some women have medical problems that increase the chance of getting cervical cancer. In these cases, your health care provider may recommend that you have screening and Pap tests more often.  If you have a family history of uterine cancer or ovarian cancer, talk with your health care provider about genetic screening.  If you have vaginal bleeding after reaching menopause, tell your health care provider.  There are currently no reliable tests available to screen for ovarian cancer.  Lung Cancer Lung cancer screening is recommended for adults 55-80 years old who are at high risk for lung cancer because of a history of smoking. A yearly low-dose CT scan of the lungs is recommended if you:  Currently smoke.  Have a history of at least 30 pack-years of smoking and you currently smoke or have quit within the past 15 years. A pack-year is smoking an average of one pack of cigarettes per day for one year.  Yearly screening should:  Continue until it has been 15 years since you quit.  Stop if you develop a health problem that would prevent you from having lung cancer treatment.  Colorectal Cancer  This type of cancer can be detected and can often be prevented.  Routine colorectal cancer screening usually begins at age 50 and continues through age 75.  If you have risk factors for colon cancer, your health care provider may recommend that you be screened at an earlier age.  If you have a family history of colorectal cancer, talk with your health care provider about genetic screening.  Your health care provider may also recommend using home test kits to check   for hidden blood in your stool.  A small camera at the end of a tube can be used to examine your colon directly (sigmoidoscopy or colonoscopy). This is done to check for the earliest forms of colorectal cancer.  Direct examination of the colon  should be repeated every 5-10 years until age 75. However, if early forms of precancerous polyps or small growths are found or if you have a family history or genetic risk for colorectal cancer, you may need to be screened more often.  Skin Cancer  Check your skin from head to toe regularly.  Monitor any moles. Be sure to tell your health care provider: ? About any new moles or changes in moles, especially if there is a change in a mole's shape or color. ? If you have a mole that is larger than the size of a pencil eraser.  If any of your family members has a history of skin cancer, especially at a young age, talk with your health care provider about genetic screening.  Always use sunscreen. Apply sunscreen liberally and repeatedly throughout the day.  Whenever you are outside, protect yourself by wearing long sleeves, pants, a wide-brimmed hat, and sunglasses.  What should I know about osteoporosis? Osteoporosis is a condition in which bone destruction happens more quickly than new bone creation. After menopause, you may be at an increased risk for osteoporosis. To help prevent osteoporosis or the bone fractures that can happen because of osteoporosis, the following is recommended:  If you are 19-50 years old, get at least 1,000 mg of calcium and at least 600 mg of vitamin D per day.  If you are older than age 50 but younger than age 70, get at least 1,200 mg of calcium and at least 600 mg of vitamin D per day.  If you are older than age 70, get at least 1,200 mg of calcium and at least 800 mg of vitamin D per day.  Smoking and excessive alcohol intake increase the risk of osteoporosis. Eat foods that are rich in calcium and vitamin D, and do weight-bearing exercises several times each week as directed by your health care provider. What should I know about how menopause affects my mental health? Depression may occur at any age, but it is more common as you become older. Common symptoms of  depression include:  Low or sad mood.  Changes in sleep patterns.  Changes in appetite or eating patterns.  Feeling an overall lack of motivation or enjoyment of activities that you previously enjoyed.  Frequent crying spells.  Talk with your health care provider if you think that you are experiencing depression. What should I know about immunizations? It is important that you get and maintain your immunizations. These include:  Tetanus, diphtheria, and pertussis (Tdap) booster vaccine.  Influenza every year before the flu season begins.  Pneumonia vaccine.  Shingles vaccine.  Your health care provider may also recommend other immunizations. This information is not intended to replace advice given to you by your health care provider. Make sure you discuss any questions you have with your health care provider. Document Released: 06/07/2005 Document Revised: 11/03/2015 Document Reviewed: 01/17/2015 Elsevier Interactive Patient Education  2018 Elsevier Inc.       KIM, HANNAH R., DO   

## 2017-02-06 ENCOUNTER — Encounter: Payer: Self-pay | Admitting: Family Medicine

## 2017-02-06 ENCOUNTER — Ambulatory Visit (INDEPENDENT_AMBULATORY_CARE_PROVIDER_SITE_OTHER): Payer: Medicare Other | Admitting: Family Medicine

## 2017-02-06 VITALS — BP 100/80 | HR 77 | Temp 98.1°F | Ht 66.0 in | Wt 123.0 lb

## 2017-02-06 DIAGNOSIS — E785 Hyperlipidemia, unspecified: Secondary | ICD-10-CM | POA: Diagnosis not present

## 2017-02-06 DIAGNOSIS — Z0000 Encounter for general adult medical examination without abnormal findings: Secondary | ICD-10-CM | POA: Diagnosis not present

## 2017-02-06 DIAGNOSIS — Z87898 Personal history of other specified conditions: Secondary | ICD-10-CM

## 2017-02-06 DIAGNOSIS — Z9189 Other specified personal risk factors, not elsewhere classified: Secondary | ICD-10-CM

## 2017-02-06 DIAGNOSIS — R232 Flushing: Secondary | ICD-10-CM | POA: Diagnosis not present

## 2017-02-06 DIAGNOSIS — Z1159 Encounter for screening for other viral diseases: Secondary | ICD-10-CM

## 2017-02-06 DIAGNOSIS — Z8639 Personal history of other endocrine, nutritional and metabolic disease: Secondary | ICD-10-CM | POA: Diagnosis not present

## 2017-02-06 LAB — BASIC METABOLIC PANEL
BUN: 11 mg/dL (ref 6–23)
CALCIUM: 9.5 mg/dL (ref 8.4–10.5)
CO2: 28 mEq/L (ref 19–32)
CREATININE: 0.83 mg/dL (ref 0.40–1.20)
Chloride: 104 mEq/L (ref 96–112)
GFR: 72.54 mL/min (ref >60.00)
Glucose, Bld: 120 mg/dL — ABNORMAL HIGH (ref 70–99)
Potassium: 4.2 mEq/L (ref 3.5–5.1)
SODIUM: 140 meq/L (ref 135–145)

## 2017-02-06 LAB — LIPID PANEL
Cholesterol: 234 mg/dL — ABNORMAL HIGH (ref 0–200)
HDL: 67.7 mg/dL (ref >39.00)
LDL Cholesterol: 148 mg/dL — ABNORMAL HIGH (ref 0–99)
NonHDL: 165.95
Total CHOL/HDL Ratio: 3
Triglycerides: 91 mg/dL (ref 0.0–149.0)
VLDL: 18.2 mg/dL (ref 0.0–40.0)

## 2017-02-06 NOTE — Progress Notes (Signed)
Andrea Zalar R., DO  

## 2017-02-06 NOTE — Progress Notes (Signed)
Advanced Directive;   Reviewed advanced directive and agreed to receipt of information and discussion.  Focused face to face x  20 minutes discussing HCPOA and Living will and reviewed all the questions in the [facility] forms. The patient voices understanding of HCPOA; LW reviewed and information provided on each question. Educated on how to revoke this HCPOA or LW at any time.   Also  discussed life prolonging measures (given a few examples) and where she could choose to initiate or not;  the ability to given the HCPOA power to change her living will or not if she cannot speak for herself; as well as finalizing the will by 2 unrelated witnesses and notary.  Will call for questions and given information on [facility] pastoral department for further assistance.   The patient does live in [place] and was referred to  http://[email or web address].org/ad-forms-overview.html for a form she can use in [place].  Requested she complete the [place] form, as she does receive her health care here in [place] and plans to move here one day.

## 2017-02-06 NOTE — Patient Instructions (Addendum)
BEFORE YOU LEAVE: -labs first, then -Advanced directives with Manuela Schwartz -follow up: in 1 year for AWV with susan and follow up with Dr. Maudie Mercury  Call the breast center today to schedule your follow up appointment.  Call [facility] for an appointment.  We have ordered labs or studies at this visit. It can take up to 1-2 weeks for results and processing. IF results require follow up or explanation, we will call you with instructions. Clinically stable results will be released to your [facility] If you have not heard from [place] or cannot find your results in [facility] in 2 weeks please contact our office at [phone].  If you are not yet signed up for [facility], please consider signing up.   Andrea Gardner , Thank you for taking time to come for your Medicare Wellness Visit. I appreciate your ongoing commitment to your health goals. Please review the following plan we discussed and let me know if I can assist you in the future.   These are the goals we discussed: Goals    Repeat breast exam at breast center Regular weight bearing aerobic exercise Healthy diet Complete advanced directives      This is a list of the screening recommended for you and due dates:  Health Maintenance  Topic Date Due  . Flu Shot  Patient declined  .  Hepatitis C: One time screening is recommended by Center for Disease Control  (CDC) for  adults born from [birth year] through 1967.   Ordered today with labs  . Pneumonia vaccines (1 of 2 - PCV13) Patient declined  . Mammogram  Patient to call to schedule  . Colon Cancer Screening  02/03/2021  . Tetanus Vaccine  Patient declined  . DEXA scan (bone density measurement)  Completed, declined repeat  *Topic was postponed. The date shown is not the original due date.      Health Maintenance for Postmenopausal Women Menopause is a normal process in which your reproductive ability comes to an end. This process happens gradually over a span of months to years, usually between the  ages of 78 and 48. Menopause is complete when you have missed 12 consecutive menstrual periods. It is important to talk with your health care provider about some of the most common conditions that affect postmenopausal women, such as heart disease, cancer, and bone loss (osteoporosis). Adopting a healthy lifestyle and getting preventive care can help to promote your health and wellness. Those actions can also lower your chances of developing some of these common conditions. What should I know about menopause? During menopause, you may experience a number of symptoms, such as:  Moderate-to-severe hot flashes.  Night sweats.  Decrease in sex drive.  Mood swings.  Headaches.  Tiredness.  Irritability.  Memory problems.  Insomnia.  Choosing to treat or not to treat menopausal changes is an individual decision that you make with your health care provider. What should I know about hormone replacement therapy and supplements? Hormone therapy products are effective for treating symptoms that are associated with menopause, such as hot flashes and night sweats. Hormone replacement carries certain risks, especially as you become older. If you are thinking about using estrogen or estrogen with progestin treatments, discuss the benefits and risks with your health care provider. What should I know about heart disease and stroke? Heart disease, heart attack, and stroke become more likely as you age. This may be due, in part, to the hormonal changes that your body experiences during menopause. These can affect  how your body processes dietary fats, triglycerides, and cholesterol. Heart attack and stroke are both medical emergencies. There are many things that you can do to help prevent heart disease and stroke:  Have your blood pressure checked at least every 1-2 years. High blood pressure causes heart disease and increases the risk of stroke.  If you are 34-67 years old, ask your health care provider  if you should take aspirin to prevent a heart attack or a stroke.  Do not use any tobacco products, including cigarettes, chewing tobacco, or electronic cigarettes. If you need help quitting, ask your health care provider.  It is important to eat a healthy diet and maintain a healthy weight. ? Be sure to include plenty of vegetables, fruits, low-fat dairy products, and lean protein. ? Avoid eating foods that are high in solid fats, added sugars, or salt (sodium).  Get regular exercise. This is one of the most important things that you can do for your health. ? Try to exercise for at least 150 minutes each week. The type of exercise that you do should increase your heart rate and make you sweat. This is known as moderate-intensity exercise. ? Try to do strengthening exercises at least twice each week. Do these in addition to the moderate-intensity exercise.  Know your numbers.Ask your health care provider to check your cholesterol and your blood glucose. Continue to have your blood tested as directed by your health care provider.  What should I know about cancer screening? There are several types of cancer. Take the following steps to reduce your risk and to catch any cancer development as early as possible. Breast Cancer  Practice breast self-awareness. ? This means understanding how your breasts normally appear and feel. ? It also means doing regular breast self-exams. Let your health care provider know about any changes, no matter how small.  If you are 42 or older, have a clinician do a breast exam (clinical breast exam or CBE) every year. Depending on your age, family history, and medical history, it may be recommended that you also have a yearly breast X-ray (mammogram).  If you have a family history of breast cancer, talk with your health care provider about genetic screening.  If you are at high risk for breast cancer, talk with your health care provider about having an MRI and a  mammogram every year.  Breast cancer (BRCA) gene test is recommended for women who have family members with BRCA-related cancers. Results of the assessment will determine the need for genetic counseling and BRCA1 and for BRCA2 testing. BRCA-related cancers include these types: ? Breast. This occurs in males or females. ? Ovarian. ? Tubal. This may also be called fallopian tube cancer. ? Cancer of the abdominal or pelvic lining (peritoneal cancer). ? Prostate. ? Pancreatic.  Cervical, Uterine, and Ovarian Cancer Your health care provider may recommend that you be screened regularly for cancer of the pelvic organs. These include your ovaries, uterus, and vagina. This screening involves a pelvic exam, which includes checking for microscopic changes to the surface of your cervix (Pap test).  For women ages 21-65, health care providers may recommend a pelvic exam and a Pap test every three years. For women ages 59-65, they may recommend the Pap test and pelvic exam, combined with testing for human papilloma virus (HPV), every five years. Some types of HPV increase your risk of cervical cancer. Testing for HPV may also be done on women of any age who have unclear Pap  test results.  Other health care providers may not recommend any screening for nonpregnant women who are considered low risk for pelvic cancer and have no symptoms. Ask your health care provider if a screening pelvic exam is right for you.  If you have had past treatment for cervical cancer or a condition that could lead to cancer, you need Pap tests and screening for cancer for at least 20 years after your treatment. If Pap tests have been discontinued for you, your risk factors (such as having a new sexual partner) need to be reassessed to determine if you should start having screenings again. Some women have medical problems that increase the chance of getting cervical cancer. In these cases, your health care provider may recommend that  you have screening and Pap tests more often.  If you have a family history of uterine cancer or ovarian cancer, talk with your health care provider about genetic screening.  If you have vaginal bleeding after reaching menopause, tell your health care provider.  There are currently no reliable tests available to screen for ovarian cancer.  Lung Cancer Lung cancer screening is recommended for adults 61-24 years old who are at high risk for lung cancer because of a history of smoking. A yearly low-dose CT scan of the lungs is recommended if you:  Currently smoke.  Have a history of at least 30 pack-years of smoking and you currently smoke or have quit within the past 15 years. A pack-year is smoking an average of one pack of cigarettes per day for one year.  Yearly screening should:  Continue until it has been 15 years since you quit.  Stop if you develop a health problem that would prevent you from having lung cancer treatment.  Colorectal Cancer  This type of cancer can be detected and can often be prevented.  Routine colorectal cancer screening usually begins at age 64 and continues through age 52.  If you have risk factors for colon cancer, your health care provider may recommend that you be screened at an earlier age.  If you have a family history of colorectal cancer, talk with your health care provider about genetic screening.  Your health care provider may also recommend using home test kits to check for hidden blood in your stool.  A small camera at the end of a tube can be used to examine your colon directly (sigmoidoscopy or colonoscopy). This is done to check for the earliest forms of colorectal cancer.  Direct examination of the colon should be repeated every 5-10 years until age 91. However, if early forms of precancerous polyps or small growths are found or if you have a family history or genetic risk for colorectal cancer, you may need to be screened more  often.  Skin Cancer  Check your skin from head to toe regularly.  Monitor any moles. Be sure to tell your health care provider: ? About any new moles or changes in moles, especially if there is a change in a mole's shape or color. ? If you have a mole that is larger than the size of a pencil eraser.  If any of your family members has a history of skin cancer, especially at a young age, talk with your health care provider about genetic screening.  Always use sunscreen. Apply sunscreen liberally and repeatedly throughout the day.  Whenever you are outside, protect yourself by wearing long sleeves, pants, a wide-brimmed hat, and sunglasses.  What should I know about osteoporosis? Osteoporosis  is a condition in which bone destruction happens more quickly than new bone creation. After menopause, you may be at an increased risk for osteoporosis. To help prevent osteoporosis or the bone fractures that can happen because of osteoporosis, the following is recommended:  If you are 31-24 years old, get at least 1,000 mg of calcium and at least 600 mg of vitamin D per day.  If you are older than age 15 but younger than age 78, get at least 1,200 mg of calcium and at least 600 mg of vitamin D per day.  If you are older than age 82, get at least 1,200 mg of calcium and at least 800 mg of vitamin D per day.  Smoking and excessive alcohol intake increase the risk of osteoporosis. Eat foods that are rich in calcium and vitamin D, and do weight-bearing exercises several times each week as directed by your health care provider. What should I know about how menopause affects my mental health? Depression may occur at any age, but it is more common as you become older. Common symptoms of depression include:  Low or sad mood.  Changes in sleep patterns.  Changes in appetite or eating patterns.  Feeling an overall lack of motivation or enjoyment of activities that you previously enjoyed.  Frequent  crying spells.  Talk with your health care provider if you think that you are experiencing depression. What should I know about immunizations? It is important that you get and maintain your immunizations. These include:  Tetanus, diphtheria, and pertussis (Tdap) booster vaccine.  Influenza every year before the flu season begins.  Pneumonia vaccine.  Shingles vaccine.  Your health care provider may also recommend other immunizations. This information is not intended to replace advice given to you by your health care provider. Make sure you discuss any questions you have with your health care provider. Document Released: 06/07/2005 Document Revised: 11/03/2015 Document Reviewed: 01/17/2015 Elsevier Interactive Patient Education  2018 [employer].

## 2017-02-07 LAB — HEPATITIS C ANTIBODY
HEP C AB: NONREACTIVE
SIGNAL TO CUT-OFF: 0 (ref <1.00)

## 2017-02-07 NOTE — Addendum Note (Signed)
Addended by: Johnella Moloney on: 02/07/2017 01:16 PM   Modules accepted: Orders

## 2017-03-03 ENCOUNTER — Other Ambulatory Visit: Payer: Self-pay | Admitting: Family Medicine

## 2017-03-12 ENCOUNTER — Other Ambulatory Visit: Payer: Self-pay | Admitting: Family Medicine

## 2017-03-12 DIAGNOSIS — N63 Unspecified lump in unspecified breast: Secondary | ICD-10-CM

## 2017-03-18 ENCOUNTER — Ambulatory Visit
Admission: RE | Admit: 2017-03-18 | Discharge: 2017-03-18 | Disposition: A | Discharge status: Home / Self Care | Payer: Medicare Other | Source: Ambulatory Visit | Attending: Family Medicine | Admitting: Family Medicine

## 2017-03-18 DIAGNOSIS — N6489 Other specified disorders of breast: Secondary | ICD-10-CM | POA: Diagnosis not present

## 2017-03-18 DIAGNOSIS — N63 Unspecified lump in unspecified breast: Secondary | ICD-10-CM

## 2017-03-18 DIAGNOSIS — R922 Inconclusive mammogram: Secondary | ICD-10-CM | POA: Diagnosis not present

## 2017-05-01 ENCOUNTER — Telehealth: Payer: Self-pay | Admitting: Family Medicine

## 2017-05-01 NOTE — Telephone Encounter (Signed)
Copied from CRM [id number]. Topic: Referral - Request >> May 01, 2017 10:47 AM Herby AbrahamJohnson, Shiquita C wrote:    Reason for CRM: pt called in. She said that she was seen by provider and advised to set up an apt with an GYN. Pt [facility] 05/07/16, pt says that they are requesting records to be faxed to them .   Fax: [phone]

## 2017-05-01 NOTE — Telephone Encounter (Signed)
Office notes from 02/06/17 faxed to [phone].

## 2017-05-07 DIAGNOSIS — E8941 Symptomatic postprocedural ovarian failure: Secondary | ICD-10-CM | POA: Diagnosis not present

## 2017-05-07 DIAGNOSIS — Z01419 Encounter for gynecological examination (general) (routine) without abnormal findings: Secondary | ICD-10-CM | POA: Diagnosis not present

## 2017-05-08 ENCOUNTER — Encounter: Payer: Self-pay | Admitting: Family Medicine

## 2017-05-13 ENCOUNTER — Ambulatory Visit (INDEPENDENT_AMBULATORY_CARE_PROVIDER_SITE_OTHER)
Admission: RE | Admit: 2017-05-13 | Discharge: 2017-05-13 | Disposition: A | Discharge status: Home / Self Care | Payer: Medicare Other | Source: Ambulatory Visit | Attending: Family Medicine | Admitting: Family Medicine

## 2017-05-13 ENCOUNTER — Encounter: Payer: Self-pay | Admitting: Family Medicine

## 2017-05-13 ENCOUNTER — Ambulatory Visit (INDEPENDENT_AMBULATORY_CARE_PROVIDER_SITE_OTHER): Payer: Medicare Other | Admitting: Family Medicine

## 2017-05-13 VITALS — BP 120/60 | HR 80 | Temp 98.3°F | Ht 66.0 in | Wt 128.6 lb

## 2017-05-13 DIAGNOSIS — R739 Hyperglycemia, unspecified: Secondary | ICD-10-CM

## 2017-05-13 DIAGNOSIS — E538 Deficiency of other specified B group vitamins: Secondary | ICD-10-CM | POA: Diagnosis not present

## 2017-05-13 DIAGNOSIS — M542 Cervicalgia: Secondary | ICD-10-CM

## 2017-05-13 DIAGNOSIS — M79602 Pain in left arm: Secondary | ICD-10-CM

## 2017-05-13 DIAGNOSIS — E210 Primary hyperparathyroidism: Secondary | ICD-10-CM | POA: Diagnosis not present

## 2017-05-13 DIAGNOSIS — E559 Vitamin D deficiency, unspecified: Secondary | ICD-10-CM

## 2017-05-13 DIAGNOSIS — R251 Tremor, unspecified: Secondary | ICD-10-CM | POA: Diagnosis not present

## 2017-05-13 LAB — CBC
HCT: 41.9 % (ref 36.0–46.0)
Hemoglobin: 14.2 g/dL (ref 12.0–15.0)
MCHC: 34 g/dL (ref 30.0–36.0)
MCV: 94.6 fl (ref 78.0–100.0)
Platelets: 186 10*3/uL (ref 150.0–400.0)
RBC: 4.43 Mil/uL (ref 3.87–5.11)
RDW: 13 % (ref 11.5–15.5)
WBC: 5 10*3/uL (ref 4.0–10.5)

## 2017-05-13 LAB — BASIC METABOLIC PANEL
BUN: 10 mg/dL (ref 6–23)
CHLORIDE: 103 meq/L (ref 96–112)
CO2: 30 meq/L (ref 19–32)
Calcium: 9.4 mg/dL (ref 8.4–10.5)
Creatinine, Ser: 0.69 mg/dL (ref 0.40–1.20)
GFR: 89.71 mL/min (ref >60.00)
Glucose, Bld: 108 mg/dL — ABNORMAL HIGH (ref 70–99)
POTASSIUM: 4.2 meq/L (ref 3.5–5.1)
SODIUM: 140 meq/L (ref 135–145)

## 2017-05-13 LAB — VITAMIN B12: VITAMIN B 12: 589 pg/mL (ref 211–911)

## 2017-05-13 LAB — HEMOGLOBIN A1C: Hgb A1c MFr Bld: 5.3 % (ref 4.6–6.5)

## 2017-05-13 LAB — TSH: TSH: 1.65 u[IU]/mL (ref 0.35–4.50)
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
## 2017-05-13 NOTE — Patient Instructions (Signed)
BEFORE YOU LEAVE: -xray sheet -labs -rotator cuff exercises -follow up: 1 month  Get labs and xray.  Sent referral for orthopedics about the neck and shoulder pain.  Do the exercises provided in the interim.  Heat and topical menthol (tiger balm) can be helpful for pain.  We have ordered labs or studies at this visit. It can take up to 1-2 weeks for results and processing. IF results require follow up or explanation, we will call you with instructions. Clinically stable results will be released to your [facility]. If you have not heard from us or cannot find your results in [facility] in 2 weeks please contact our office at [phone].  If you are not yet signed up for [facility], please consider signing up.

## 2017-05-22 ENCOUNTER — Encounter (INDEPENDENT_AMBULATORY_CARE_PROVIDER_SITE_OTHER): Payer: Self-pay | Admitting: Orthopedic Surgery

## 2017-05-22 ENCOUNTER — Ambulatory Visit (INDEPENDENT_AMBULATORY_CARE_PROVIDER_SITE_OTHER): Payer: Medicare Other

## 2017-05-22 ENCOUNTER — Ambulatory Visit (INDEPENDENT_AMBULATORY_CARE_PROVIDER_SITE_OTHER): Payer: Medicare Other | Admitting: Orthopedic Surgery

## 2017-05-22 DIAGNOSIS — G8929 Other chronic pain: Secondary | ICD-10-CM

## 2017-05-22 DIAGNOSIS — M25512 Pain in left shoulder: Secondary | ICD-10-CM

## 2017-05-22 DIAGNOSIS — M7502 Adhesive capsulitis of left shoulder: Secondary | ICD-10-CM | POA: Diagnosis not present

## 2017-05-23 NOTE — Progress Notes (Signed)
Office Visit Note   Patient: Andrea Gardner           Date of Birth: 10-20-1950           MRN: [id number] Visit Date: 05/22/2017 Requested by: Terressa Koyanagi, DO [address] [place], [place] [address] PCP: Terressa Koyanagi, DO  Subjective: Chief Complaint  Patient presents with  . Left Shoulder - Pain    HPI: Andrea Gardner is a patient with left shoulder and arm pain.  She reports increase in pain since December.  Pain only occurs with certain movements.  Denies any weakness.  She is right-hand dominant.  Does not take any medicine for pain.  It hurts for her to extend the arm and to reach behind her back.  Reports occasional neck pain but no numbness and tingling.  Denies any popping or grinding.  He does not work.  She cannot take nonsteroidals.              ROS: All systems reviewed are negative as they relate to the chief complaint within the history of present illness.  Patient denies  fevers or chills.   Assessment & Plan: Visit Diagnoses:  1. Chronic left shoulder pain   2. Adhesive capsulitis of left shoulder     Plan: Impression is early left frozen shoulder.  Patient does have early mild restriction of passive range of motion.  Plan is home exercise program.  We talked about an injection today but she wants to hold off on that intervention.  She cannot take nonsteroidals.  Let us see how she does over the next 6 weeks to regain her range of motion.  Could consider injection at that time.  Follow-Up Instructions: Return in about 6 weeks (around 07/03/2017).   Orders:  Orders Placed This Encounter  Procedures  . XR Shoulder Left   No orders of the defined types were placed in this encounter.     Procedures: No procedures performed   Clinical Data: No additional findings.  Objective: Vital Signs: There were no vitals taken for this visit.  Physical Exam:   Constitutional: Patient appears well-developed HEENT:  Head: Normocephalic Eyes:EOM are  normal Neck: Normal range of motion Cardiovascular: Normal rate Pulmonary/chest: Effort normal Neurologic: Patient is alert Skin: Skin is warm Psychiatric: Patient has normal mood and affect    Ortho Exam: Orthopedic exam demonstrates good cervical spine range of motion.  Radial pulse intact on the left.  She does have some restriction of passive range of motion with forward flexion and external rotation left versus right.  The difference is about 15 degrees.  No AC joint tenderness.  No other masses lymphadenopathy or skin changes noted in the shoulder girdle region.  Specialty Comments:  No specialty comments available.  Imaging: No results found.   PMFS History: Patient Active Problem List   Diagnosis Date Noted  . Hot flashes 02/06/2017  . Hx of hyperparathyroidism 02/06/2017  . Hyperlipemia 08/10/2015   Past Medical History:  Diagnosis Date  . Family history of breast cancer    FH Burgess Amor; her genetic testing was negative for this mutation  . Frequent headaches   . History of blood transfusion   . History of UTI   . Hyperparathyroidism [facility])    s/p surgical treatment in 2013    Family History  Problem Relation Age of Onset  . Arthritis Father   . Hyperlipidemia Father   . Hypertension Father   . Heart disease Father   .  Breast cancer Mother   . Breast cancer Sister   . Breast cancer Maternal Aunt   . Li-Fraumeni syndrome Sister     Past Surgical History:  Procedure Laterality Date  . ABDOMINAL HYSTERECTOMY     complete for endometriosis and fibroids  . APPENDECTOMY     w/ hysterectomy  . PARATHYROID EXPLORATION     Social History   Occupational History  . Not on file  Tobacco Use  . Smoking status: Former Games developermoker  . Smokeless tobacco: Never Used  Substance and Sexual Activity  . Alcohol use: Not on file    Comment: 1 glass of wine daily  . Drug use: Not on file  . Sexual activity: Not on file

## 2017-05-26 DIAGNOSIS — M7502 Adhesive capsulitis of left shoulder: Secondary | ICD-10-CM | POA: Diagnosis not present

## 2017-05-27 DIAGNOSIS — M7502 Adhesive capsulitis of left shoulder: Secondary | ICD-10-CM | POA: Diagnosis not present

## 2017-05-30 DIAGNOSIS — M7502 Adhesive capsulitis of left shoulder: Secondary | ICD-10-CM | POA: Diagnosis not present

## 2017-06-02 DIAGNOSIS — M7502 Adhesive capsulitis of left shoulder: Secondary | ICD-10-CM | POA: Diagnosis not present

## 2017-06-04 DIAGNOSIS — M7502 Adhesive capsulitis of left shoulder: Secondary | ICD-10-CM | POA: Diagnosis not present

## 2017-06-06 DIAGNOSIS — M7502 Adhesive capsulitis of left shoulder: Secondary | ICD-10-CM | POA: Diagnosis not present

## 2017-06-09 DIAGNOSIS — M7502 Adhesive capsulitis of left shoulder: Secondary | ICD-10-CM | POA: Diagnosis not present

## 2017-06-11 DIAGNOSIS — M7502 Adhesive capsulitis of left shoulder: Secondary | ICD-10-CM | POA: Diagnosis not present

## 2017-06-11 NOTE — Progress Notes (Signed)
HPI:  Follow up. Seen 04/2017 for various different complaints including chronic involuntary quivering (can not see this, but feels it intermittently in medial thighs and abd if she pushes these areas firmly with her hands) and L shoulder/neck pain. Labs were unremarkable. Cervical plain films with mild DDD. Referred to ortho for neck/shoulder pain.  Reports shoulder is doing somewhat better.  She was diagnosed with frozen shoulder and is doing physical therapy for this.  She has follow-up with her orthopedic specialist in a few weeks.  The tremulous sensation occurs intermittently in the medial thighs and lower abdominal muscle area, she reports she actually feels anxious, nervous and shaky all over sometimes.  There is no rhyme or reason to this and you can feel this way for several days, then not feel this way for some time.  She is started back to exercising and she thinks this is improving.  Denies weakness, numbness, panic attacks, fever, malaise, unexplained weight loss, bowel or bladder incontinence.  Does not want to do any further workup for this for now.    ROS: See pertinent positives and negatives per HPI.  Past Medical History:  Diagnosis Date  . Family history of breast cancer    FH Burgess AmorLi Fraumeni; her genetic testing was negative for this mutation  . Frequent headaches   . History of blood transfusion   . History of UTI   . Hyperparathyroidism [facility](HCC)    s/p surgical treatment in 2013    Past Surgical History:  Procedure Laterality Date  . ABDOMINAL HYSTERECTOMY     complete for endometriosis and fibroids  . APPENDECTOMY     w/ hysterectomy  . PARATHYROID EXPLORATION      Family History  Problem Relation Age of Onset  . Arthritis Father   . Hyperlipidemia Father   . Hypertension Father   . Heart disease Father   . Breast cancer Mother   . Breast cancer Sister   . Breast cancer Maternal Aunt   . Li-Fraumeni syndrome Sister     Social History   Socioeconomic History   . Marital status: Married    Spouse name: None  . Number of children: None  . Years of education: None  . Highest education level: None  Social Needs  . Financial resource strain: None  . Food insecurity - worry: None  . Food insecurity - inability: None  . Transportation needs - medical: None  . Transportation needs - non-medical: None  Occupational History  . None  Tobacco Use  . Smoking status: Former Games developermoker  . Smokeless tobacco: Never Used  Substance and Sexual Activity  . Alcohol use: None    Comment: 1 glass of wine daily  . Drug use: None  . Sexual activity: None  Other Topics Concern  . None  Social History Narrative   Work or School: retired Corporate investment bankerbookkeeper      Home Situation: lives with husband      Spiritual Beliefs: Christian      Lifestyle: regular exercise; healthy diet        Current Outpatient Medications:  .  Ascorbic Acid (VITAMIN C) 1000 MG tablet, Take 1,000 mg by mouth daily., Disp: , Rfl:  .  BIOTIN PO, Take 100,000 mg by mouth daily., Disp: , Rfl:  .  Calcium-Phosphorus-Vitamin D (CITRACAL +D3 PO), Take 250 mg by mouth daily. , Disp: , Rfl:  .  Cholecalciferol (VITAMIN D-3 PO), Take 5,000 Units by mouth daily., Disp: , Rfl:  .  estradiol (CLIMARA -  DOSED IN MG/24 HR) 0.05 mg/24hr patch, APPLY ONE PATCH TOPICALLY ONCE A WEEK, Disp: 4 patch, Rfl: 11 .  magnesium oxide (MAG-OX) 400 MG tablet, Take 400 mg by mouth 2 (two) times daily., Disp: , Rfl:  .  OVER THE COUNTER MEDICATION, Supplement with vitamin B6, B12 and folate, Disp: , Rfl:  .  Probiotic Product (PROBIOTIC PO), Take by mouth., Disp: , Rfl:   EXAM:  Vitals:   06/12/17 1050  BP: 124/62  Pulse: 81  Temp: (!) 97.2 F (36.2 C)    Body mass index is 20.64 kg/m.  GENERAL: vitals reviewed and listed above, alert, oriented, appears well hydrated and in no acute distress  HEENT: atraumatic, conjunttiva clear, no obvious abnormalities on inspection of external nose and ears  NECK: no  obvious masses on inspection  LUNGS: clear to auscultation bilaterally, no wheezes, rales or rhonchi, good air movement  CV: HRRR, no peripheral edema  MS/NEURO: moves all extremities without noticeable abnormality, normal strength in the lower extremities throughout, no acute lesions or tremors noted in the lower extremities, abdomen, head or upper extremities  PSYCH: pleasant and cooperative, no obvious depression or anxiety  ASSESSMENT AND PLAN:  Discussed the following assessment and plan:  Chronic left shoulder pain  Tremulousness  -Seems to be improving overall -We discussed various causes of or tremulous sensation, and evaluation.  She has declined any further evaluation for now. -Follow-up in about a month -Patient advised to return or notify a doctor immediately if symptoms worsen or persist or new concerns arise. Declined DBS. There are no Patient Instructions on file for this visit.  Terressa Koyanagi, DO

## 2017-06-12 ENCOUNTER — Ambulatory Visit (INDEPENDENT_AMBULATORY_CARE_PROVIDER_SITE_OTHER): Payer: Medicare Other | Admitting: Family Medicine

## 2017-06-12 ENCOUNTER — Encounter: Payer: Self-pay | Admitting: Family Medicine

## 2017-06-12 VITALS — BP 124/62 | HR 81 | Temp 97.2°F | Ht 66.0 in | Wt 127.9 lb

## 2017-06-12 DIAGNOSIS — G8929 Other chronic pain: Secondary | ICD-10-CM | POA: Diagnosis not present

## 2017-06-12 DIAGNOSIS — M25512 Pain in left shoulder: Secondary | ICD-10-CM | POA: Diagnosis not present

## 2017-06-12 DIAGNOSIS — R251 Tremor, unspecified: Secondary | ICD-10-CM

## 2017-06-13 DIAGNOSIS — M7502 Adhesive capsulitis of left shoulder: Secondary | ICD-10-CM | POA: Diagnosis not present

## 2017-06-16 DIAGNOSIS — M7502 Adhesive capsulitis of left shoulder: Secondary | ICD-10-CM | POA: Diagnosis not present

## 2017-06-18 DIAGNOSIS — M7502 Adhesive capsulitis of left shoulder: Secondary | ICD-10-CM | POA: Diagnosis not present

## 2017-06-19 DIAGNOSIS — M7502 Adhesive capsulitis of left shoulder: Secondary | ICD-10-CM | POA: Diagnosis not present

## 2017-06-23 ENCOUNTER — Encounter (INDEPENDENT_AMBULATORY_CARE_PROVIDER_SITE_OTHER): Payer: Self-pay | Admitting: Orthopedic Surgery

## 2017-06-23 ENCOUNTER — Ambulatory Visit (INDEPENDENT_AMBULATORY_CARE_PROVIDER_SITE_OTHER): Payer: Medicare Other | Admitting: Orthopedic Surgery

## 2017-06-23 DIAGNOSIS — M7502 Adhesive capsulitis of left shoulder: Secondary | ICD-10-CM | POA: Diagnosis not present

## 2017-06-24 ENCOUNTER — Encounter (INDEPENDENT_AMBULATORY_CARE_PROVIDER_SITE_OTHER): Payer: Self-pay | Admitting: Orthopedic Surgery

## 2017-06-24 DIAGNOSIS — M7502 Adhesive capsulitis of left shoulder: Secondary | ICD-10-CM | POA: Diagnosis not present

## 2017-06-24 NOTE — Progress Notes (Signed)
Office Visit Note   Patient: Andrea Gardner           Date of Birth: 12/28/1950           MRN: [id number] Visit Date: 06/23/2017 Requested by: Terressa KoyanagiKim, Hannah R, DO [address] [place], [place] [id number] PCP: Terressa KoyanagiKim, Hannah R, DO  Subjective: Chief Complaint  Patient presents with  . Left Shoulder - Follow-up    HPI: Patient presents for follow-up of left shoulder pain.  She is doing a lot better but still reports some decrease in range of motion.  She is doing physical therapy 3 times a week for stretching.  Not taking any medication for pain.              ROS: All systems reviewed are negative as they relate to the chief complaint within the history of present illness.  Patient denies  fevers or chills.   Assessment & Plan: Visit Diagnoses:  1. Adhesive capsulitis of left shoulder     Plan: Impression is left shoulder adhesive capsulitis with improvement in range of motion.  Plan is physical therapy 1 time a week for 8 weeks plus a home exercise program.  Injection would be the next step to try to increase her range of motion.  Follow-up with me as needed.  Follow-Up Instructions: Return if symptoms worsen or fail to improve.   Orders:  No orders of the defined types were placed in this encounter.  No orders of the defined types were placed in this encounter.     Procedures: No procedures performed   Clinical Data: No additional findings.  Objective: Vital Signs: There were no vitals taken for this visit.  Physical Exam:   Constitutional: Patient appears well-developed HEENT:  Head: Normocephalic Eyes:EOM are normal Neck: Normal range of motion Cardiovascular: Normal rate Pulmonary/chest: Effort normal Neurologic: Patient is alert Skin: Skin is warm Psychiatric: Patient has normal mood and affect    Ortho Exam: Orthopedic exam demonstrates good rotator cuff strength and neck range of motion.  She has about 25 degrees of external rotation at 15  degrees of abduction 70 degrees of isolated glenohumeral abduction and 130 of forward flexion.  This has improved.  No other masses lymphadenopathy or skin changes noted in the shoulder girdle region  Specialty Comments:  No specialty comments available.  Imaging: No results found.   PMFS History: Patient Active Problem List   Diagnosis Date Noted  . Hot flashes 02/06/2017  . Hx of hyperparathyroidism 02/06/2017  . Hyperlipemia 08/10/2015   Past Medical History:  Diagnosis Date  . Family history of breast cancer    FH Burgess AmorLi Fraumeni; her genetic testing was negative for this mutation  . Frequent headaches   . History of blood transfusion   . History of UTI   . Hyperparathyroidism [facility](HCC)    s/p surgical treatment in 2013    Family History  Problem Relation Age of Onset  . Arthritis Father   . Hyperlipidemia Father   . Hypertension Father   . Heart disease Father   . Breast cancer Mother   . Breast cancer Sister   . Breast cancer Maternal Aunt   . Li-Fraumeni syndrome Sister     Past Surgical History:  Procedure Laterality Date  . ABDOMINAL HYSTERECTOMY     complete for endometriosis and fibroids  . APPENDECTOMY     w/ hysterectomy  . PARATHYROID EXPLORATION     Social History   Occupational History  . Not  on file  Tobacco Use  . Smoking status: Former Games developer  . Smokeless tobacco: Never Used  Substance and Sexual Activity  . Alcohol use: Not on file    Comment: 1 glass of wine daily  . Drug use: Not on file  . Sexual activity: Not on file

## 2017-07-01 DIAGNOSIS — M7502 Adhesive capsulitis of left shoulder: Secondary | ICD-10-CM | POA: Diagnosis not present

## 2017-07-07 ENCOUNTER — Other Ambulatory Visit (INDEPENDENT_AMBULATORY_CARE_PROVIDER_SITE_OTHER): Payer: Medicare Other

## 2017-07-07 DIAGNOSIS — E785 Hyperlipidemia, unspecified: Secondary | ICD-10-CM

## 2017-07-07 DIAGNOSIS — M7502 Adhesive capsulitis of left shoulder: Secondary | ICD-10-CM | POA: Diagnosis not present

## 2017-07-07 LAB — LIPID PANEL
Cholesterol: 195 mg/dL (ref 0–200)
HDL: 58.5 mg/dL (ref >39.00)
LDL CALC: 115 mg/dL — AB (ref 0–99)
NonHDL: 136.21
Total CHOL/HDL Ratio: 3
Triglycerides: 104 mg/dL (ref 0.0–149.0)
VLDL: 20.8 mg/dL (ref 0.0–40.0)

## 2017-07-10 ENCOUNTER — Encounter: Payer: Self-pay | Admitting: Family Medicine

## 2017-07-10 ENCOUNTER — Ambulatory Visit (INDEPENDENT_AMBULATORY_CARE_PROVIDER_SITE_OTHER): Payer: Medicare Other | Admitting: Family Medicine

## 2017-07-10 VITALS — BP 100/58 | HR 67 | Temp 98.0°F | Ht 66.0 in | Wt 125.4 lb

## 2017-07-10 DIAGNOSIS — R251 Tremor, unspecified: Secondary | ICD-10-CM

## 2017-07-10 DIAGNOSIS — E785 Hyperlipidemia, unspecified: Secondary | ICD-10-CM

## 2017-07-10 DIAGNOSIS — M7502 Adhesive capsulitis of left shoulder: Secondary | ICD-10-CM

## 2017-07-10 NOTE — Progress Notes (Signed)
HPI:  Using dictation device. Unfortunately this device frequently misinterprets words/phrases.  AVW 02/06/17  PMH hld, hyperparathyroidism (s/p surgical correction), hot flashes (sees gyn for management), vit D def here for regular follow up. Has seen us recently for shaking in thighs when she presses on them. Seeing Rise PaganiniGregory Dean for L shoulder pain. Cholesterol looked much better on her recent labs.  She has been really working on trying to eat healthier, cutting out nighttime snacking.  She also has increased her aerobic exercise is walking on the treadmill more.  She reports she is feeling much better.  She continues to do physical therapy for her arm and now can lift it almost fully above her head.  She also has noticed with increased exercise that the shakiness in the thighs has gotten way better and has almost completely resolved recently.  ROS: See pertinent positives and negatives per HPI.  Past Medical History:  Diagnosis Date  . Family history of breast cancer    FH Burgess AmorLi Fraumeni; her genetic testing was negative for this mutation  . Frequent headaches   . History of blood transfusion   . History of UTI   . Hyperparathyroidism [facility](HCC)    s/p surgical treatment in 2013    Past Surgical History:  Procedure Laterality Date  . ABDOMINAL HYSTERECTOMY     complete for endometriosis and fibroids  . APPENDECTOMY     w/ hysterectomy  . PARATHYROID EXPLORATION      Family History  Problem Relation Age of Onset  . Arthritis Father   . Hyperlipidemia Father   . Hypertension Father   . Heart disease Father   . Breast cancer Mother   . Breast cancer Sister   . Breast cancer Maternal Aunt   . Li-Fraumeni syndrome Sister     SOCIAL HX: Has increased exercise and has improved her diet, updated 06/2017   Current Outpatient Medications:  .  Ascorbic Acid (VITAMIN C) 1000 MG tablet, Take 1,000 mg by mouth daily., Disp: , Rfl:  .  BIOTIN PO, Take 100,000 mg by mouth daily., Disp: ,  Rfl:  .  Calcium-Phosphorus-Vitamin D (CITRACAL +D3 PO), Take 250 mg by mouth daily. , Disp: , Rfl:  .  Cholecalciferol (VITAMIN D-3 PO), Take 5,000 Units by mouth daily., Disp: , Rfl:  .  estradiol (CLIMARA - DOSED IN MG/24 HR) 0.05 mg/24hr patch, APPLY ONE PATCH TOPICALLY ONCE A WEEK, Disp: 4 patch, Rfl: 11 .  magnesium oxide (MAG-OX) 400 MG tablet, Take 400 mg by mouth 2 (two) times daily., Disp: , Rfl:  .  OVER THE COUNTER MEDICATION, Supplement with vitamin B6, B12 and folate, Disp: , Rfl:  .  Probiotic Product (PROBIOTIC PO), Take by mouth., Disp: , Rfl:   EXAM:  Vitals:   07/10/17 1427  BP: (!) 100/58  Pulse: 67  Temp: 98 F (36.7 C)    Body mass index is 20.24 kg/m.  GENERAL: vitals reviewed and listed above, alert, oriented, appears well hydrated and in no acute distress  HEENT: atraumatic, conjunttiva clear, no obvious abnormalities on inspection of external nose and ears  NECK: no obvious masses on inspection  LUNGS: clear to auscultation bilaterally, no wheezes, rales or rhonchi, good air movement  CV: HRRR, no peripheral edema  MS: Can almost fully flex, extend and abduct her left arm arm, normal gait  PSYCH: pleasant and cooperative, no obvious depression or anxiety  ASSESSMENT AND PLAN:  Discussed the following assessment and plan:  Hyperlipidemia, unspecified hyperlipidemia type  Shakiness  Adhesive capsulitis of left shoulder  -Congratulated her on lifestyle changes and encouraged her to continue lifelong healthy habits, included a handout regarding this with her patient instructions -I am glad her cholesterol is improved so dramatically and congratulated her on this -I am glad that her shoulder and the shakiness are so much better and now almost resolved -we were going to do further testing, but we will hold off and monitor for now -Follow-up at physical, sooner as needed  Patient Instructions  BEFORE YOU LEAVE: -follow up: Annual wellness visit  with Darl Pikes and follow-up with Dr. Selena Batten in October  Keep up the great work with increased exercise and healthier eating habits!  I am so glad that you are feeling better.  Follow-up sooner if any concerns.  Otherwise, continue a healthy lifestyle per guidelines below.   We recommend the following healthy lifestyle for LIFE: 1) Small portions. But, make sure to get regular (at least 3 per day), healthy meals and small healthy snacks if needed.  2) Eat a healthy clean diet.   TRY TO EAT: -at least 5-7 servings of low sugar, colorful, and nutrient rich vegetables per day (not corn, potatoes or bananas.) -berries are the best choice if you wish to eat fruit (only eat small amounts if trying to reduce weight)  -lean meets (fish, white meat of chicken or [place]) -vegan proteins for some meals - beans or tofu, whole grains, nuts and seeds -Replace bad fats with good fats - good fats include: fish, nuts and seeds, canola oil, olive oil -small amounts of low fat or non fat dairy -small amounts of100 % whole grains - check the lables -drink plenty of water  AVOID: -SUGAR, sweets, anything with added sugar, corn syrup or sweeteners - must read labels as even foods advertised as "healthy" often are loaded with sugar -if you must have a sweetener, small amounts of stevia may be best -sweetened beverages and artificially sweetened beverages -simple starches (rice, bread, potatoes, pasta, chips, etc - small amounts of 100% whole grains are ok) -red meat, pork, butter -fried foods, fast food, processed food, excessive dairy, eggs and coconut.  3)Get at least 150 minutes of sweaty aerobic exercise per week.  4)Reduce stress - consider counseling, meditation and relaxation to balance other aspects of your life.    Terressa Koyanagi, DO

## 2017-07-10 NOTE — Patient Instructions (Signed)
BEFORE YOU LEAVE: -follow up: Annual wellness visit with Darl PikesSusan and follow-up with Dr. Selena BattenKim in October  Keep up the great work with increased exercise and healthier eating habits!  I am so glad that you are feeling better.  Follow-up sooner if any concerns.  Otherwise, continue a healthy lifestyle per guidelines below.   We recommend the following healthy lifestyle for LIFE: 1) Small portions. But, make sure to get regular (at least 3 per day), healthy meals and small healthy snacks if needed.  2) Eat a healthy clean diet.   TRY TO EAT: -at least 5-7 servings of low sugar, colorful, and nutrient rich vegetables per day (not corn, potatoes or bananas.) -berries are the best choice if you wish to eat fruit (only eat small amounts if trying to reduce weight)  -lean meets (fish, white meat of chicken or [place]) -vegan proteins for some meals - beans or tofu, whole grains, nuts and seeds -Replace bad fats with good fats - good fats include: fish, nuts and seeds, canola oil, olive oil -small amounts of low fat or non fat dairy -small amounts of100 % whole grains - check the lables -drink plenty of water  AVOID: -SUGAR, sweets, anything with added sugar, corn syrup or sweeteners - must read labels as even foods advertised as "healthy" often are loaded with sugar -if you must have a sweetener, small amounts of stevia may be best -sweetened beverages and artificially sweetened beverages -simple starches (rice, bread, potatoes, pasta, chips, etc - small amounts of 100% whole grains are ok) -red meat, pork, butter -fried foods, fast food, processed food, excessive dairy, eggs and coconut.  3)Get at least 150 minutes of sweaty aerobic exercise per week.  4)Reduce stress - consider counseling, meditation and relaxation to balance other aspects of your life.

## 2017-07-21 DIAGNOSIS — M7502 Adhesive capsulitis of left shoulder: Secondary | ICD-10-CM | POA: Diagnosis not present

## 2017-07-28 DIAGNOSIS — M7502 Adhesive capsulitis of left shoulder: Secondary | ICD-10-CM | POA: Diagnosis not present

## 2017-08-01 ENCOUNTER — Telehealth (INDEPENDENT_AMBULATORY_CARE_PROVIDER_SITE_OTHER): Payer: Self-pay | Admitting: Orthopedic Surgery

## 2017-08-01 NOTE — Telephone Encounter (Signed)
Please advise. Thanks.  

## 2017-08-01 NOTE — Telephone Encounter (Signed)
Patient is going to be heading to [place] to take care of daughter that is currently in rehab for a fall. She states she will not be able to do her therapy in [place] while she's taking care of her daughter and was wondering if it would be okay for her to stop at least til the end of April or if she could possibly go to [place]hab out in [place]. CB # [phone]

## 2017-08-01 NOTE — Telephone Encounter (Signed)
The main thing and that needs to do is to continue to work on moving her shoulder.  She can consider doing rehab in [place] or she can just get a home exercise program from her current therapist.  Either is fine with me.

## 2017-08-04 NOTE — Telephone Encounter (Signed)
IC , no answer LMVM advising per Dr Dean.  

## 2017-08-21 ENCOUNTER — Ambulatory Visit (INDEPENDENT_AMBULATORY_CARE_PROVIDER_SITE_OTHER): Payer: Medicare Other | Admitting: Orthopedic Surgery

## 2017-08-22 ENCOUNTER — Ambulatory Visit (INDEPENDENT_AMBULATORY_CARE_PROVIDER_SITE_OTHER): Payer: Medicare Other | Admitting: Orthopedic Surgery

## 2017-10-22 DIAGNOSIS — M9902 Segmental and somatic dysfunction of thoracic region: Secondary | ICD-10-CM | POA: Diagnosis not present

## 2017-10-22 DIAGNOSIS — M9901 Segmental and somatic dysfunction of cervical region: Secondary | ICD-10-CM | POA: Diagnosis not present

## 2017-10-22 DIAGNOSIS — M546 Pain in thoracic spine: Secondary | ICD-10-CM | POA: Diagnosis not present

## 2017-10-22 DIAGNOSIS — M542 Cervicalgia: Secondary | ICD-10-CM | POA: Diagnosis not present

## 2017-10-24 DIAGNOSIS — M9902 Segmental and somatic dysfunction of thoracic region: Secondary | ICD-10-CM | POA: Diagnosis not present

## 2017-10-24 DIAGNOSIS — M9901 Segmental and somatic dysfunction of cervical region: Secondary | ICD-10-CM | POA: Diagnosis not present

## 2017-10-24 DIAGNOSIS — M542 Cervicalgia: Secondary | ICD-10-CM | POA: Diagnosis not present

## 2017-10-24 DIAGNOSIS — M546 Pain in thoracic spine: Secondary | ICD-10-CM | POA: Diagnosis not present

## 2017-10-31 DIAGNOSIS — M9901 Segmental and somatic dysfunction of cervical region: Secondary | ICD-10-CM | POA: Diagnosis not present

## 2017-10-31 DIAGNOSIS — M9902 Segmental and somatic dysfunction of thoracic region: Secondary | ICD-10-CM | POA: Diagnosis not present

## 2017-10-31 DIAGNOSIS — M542 Cervicalgia: Secondary | ICD-10-CM | POA: Diagnosis not present

## 2017-10-31 DIAGNOSIS — M546 Pain in thoracic spine: Secondary | ICD-10-CM | POA: Diagnosis not present

## 2017-11-07 DIAGNOSIS — M546 Pain in thoracic spine: Secondary | ICD-10-CM | POA: Diagnosis not present

## 2017-11-07 DIAGNOSIS — M542 Cervicalgia: Secondary | ICD-10-CM | POA: Diagnosis not present

## 2017-11-07 DIAGNOSIS — M9902 Segmental and somatic dysfunction of thoracic region: Secondary | ICD-10-CM | POA: Diagnosis not present

## 2017-11-07 DIAGNOSIS — M9901 Segmental and somatic dysfunction of cervical region: Secondary | ICD-10-CM | POA: Diagnosis not present

## 2017-11-25 DIAGNOSIS — R600 Localized edema: Secondary | ICD-10-CM | POA: Diagnosis not present

## 2017-11-28 DIAGNOSIS — M9902 Segmental and somatic dysfunction of thoracic region: Secondary | ICD-10-CM | POA: Diagnosis not present

## 2017-11-28 DIAGNOSIS — M542 Cervicalgia: Secondary | ICD-10-CM | POA: Diagnosis not present

## 2017-11-28 DIAGNOSIS — M546 Pain in thoracic spine: Secondary | ICD-10-CM | POA: Diagnosis not present

## 2017-11-28 DIAGNOSIS — M9901 Segmental and somatic dysfunction of cervical region: Secondary | ICD-10-CM | POA: Diagnosis not present

## 2017-12-01 DIAGNOSIS — M9902 Segmental and somatic dysfunction of thoracic region: Secondary | ICD-10-CM | POA: Diagnosis not present

## 2017-12-01 DIAGNOSIS — M9901 Segmental and somatic dysfunction of cervical region: Secondary | ICD-10-CM | POA: Diagnosis not present

## 2017-12-01 DIAGNOSIS — M546 Pain in thoracic spine: Secondary | ICD-10-CM | POA: Diagnosis not present

## 2017-12-01 DIAGNOSIS — M542 Cervicalgia: Secondary | ICD-10-CM | POA: Diagnosis not present

## 2017-12-03 DIAGNOSIS — M542 Cervicalgia: Secondary | ICD-10-CM | POA: Diagnosis not present

## 2017-12-03 DIAGNOSIS — M546 Pain in thoracic spine: Secondary | ICD-10-CM | POA: Diagnosis not present

## 2017-12-03 DIAGNOSIS — M9901 Segmental and somatic dysfunction of cervical region: Secondary | ICD-10-CM | POA: Diagnosis not present

## 2017-12-03 DIAGNOSIS — M9902 Segmental and somatic dysfunction of thoracic region: Secondary | ICD-10-CM | POA: Diagnosis not present

## 2017-12-08 DIAGNOSIS — M546 Pain in thoracic spine: Secondary | ICD-10-CM | POA: Diagnosis not present

## 2017-12-08 DIAGNOSIS — M542 Cervicalgia: Secondary | ICD-10-CM | POA: Diagnosis not present

## 2017-12-08 DIAGNOSIS — M9902 Segmental and somatic dysfunction of thoracic region: Secondary | ICD-10-CM | POA: Diagnosis not present

## 2017-12-08 DIAGNOSIS — M9901 Segmental and somatic dysfunction of cervical region: Secondary | ICD-10-CM | POA: Diagnosis not present

## 2017-12-12 DIAGNOSIS — M542 Cervicalgia: Secondary | ICD-10-CM | POA: Diagnosis not present

## 2017-12-12 DIAGNOSIS — M9902 Segmental and somatic dysfunction of thoracic region: Secondary | ICD-10-CM | POA: Diagnosis not present

## 2017-12-12 DIAGNOSIS — M9901 Segmental and somatic dysfunction of cervical region: Secondary | ICD-10-CM | POA: Diagnosis not present

## 2017-12-12 DIAGNOSIS — M546 Pain in thoracic spine: Secondary | ICD-10-CM | POA: Diagnosis not present

## 2017-12-16 DIAGNOSIS — M542 Cervicalgia: Secondary | ICD-10-CM | POA: Diagnosis not present

## 2017-12-16 DIAGNOSIS — M9902 Segmental and somatic dysfunction of thoracic region: Secondary | ICD-10-CM | POA: Diagnosis not present

## 2017-12-16 DIAGNOSIS — M546 Pain in thoracic spine: Secondary | ICD-10-CM | POA: Diagnosis not present

## 2017-12-16 DIAGNOSIS — M9901 Segmental and somatic dysfunction of cervical region: Secondary | ICD-10-CM | POA: Diagnosis not present

## 2017-12-19 DIAGNOSIS — M546 Pain in thoracic spine: Secondary | ICD-10-CM | POA: Diagnosis not present

## 2017-12-19 DIAGNOSIS — M9902 Segmental and somatic dysfunction of thoracic region: Secondary | ICD-10-CM | POA: Diagnosis not present

## 2017-12-19 DIAGNOSIS — M542 Cervicalgia: Secondary | ICD-10-CM | POA: Diagnosis not present

## 2017-12-19 DIAGNOSIS — M9901 Segmental and somatic dysfunction of cervical region: Secondary | ICD-10-CM | POA: Diagnosis not present

## 2017-12-22 DIAGNOSIS — M9902 Segmental and somatic dysfunction of thoracic region: Secondary | ICD-10-CM | POA: Diagnosis not present

## 2017-12-22 DIAGNOSIS — M542 Cervicalgia: Secondary | ICD-10-CM | POA: Diagnosis not present

## 2017-12-22 DIAGNOSIS — M546 Pain in thoracic spine: Secondary | ICD-10-CM | POA: Diagnosis not present

## 2017-12-22 DIAGNOSIS — M9901 Segmental and somatic dysfunction of cervical region: Secondary | ICD-10-CM | POA: Diagnosis not present

## 2017-12-23 DIAGNOSIS — M79672 Pain in left foot: Secondary | ICD-10-CM | POA: Diagnosis not present

## 2017-12-23 DIAGNOSIS — M25579 Pain in unspecified ankle and joints of unspecified foot: Secondary | ICD-10-CM | POA: Diagnosis not present

## 2017-12-24 DIAGNOSIS — M9901 Segmental and somatic dysfunction of cervical region: Secondary | ICD-10-CM | POA: Diagnosis not present

## 2017-12-24 DIAGNOSIS — M9902 Segmental and somatic dysfunction of thoracic region: Secondary | ICD-10-CM | POA: Diagnosis not present

## 2017-12-24 DIAGNOSIS — M542 Cervicalgia: Secondary | ICD-10-CM | POA: Diagnosis not present

## 2017-12-24 DIAGNOSIS — M546 Pain in thoracic spine: Secondary | ICD-10-CM | POA: Diagnosis not present

## 2017-12-26 DIAGNOSIS — M9901 Segmental and somatic dysfunction of cervical region: Secondary | ICD-10-CM | POA: Diagnosis not present

## 2017-12-26 DIAGNOSIS — M542 Cervicalgia: Secondary | ICD-10-CM | POA: Diagnosis not present

## 2017-12-26 DIAGNOSIS — M9902 Segmental and somatic dysfunction of thoracic region: Secondary | ICD-10-CM | POA: Diagnosis not present

## 2017-12-26 DIAGNOSIS — M546 Pain in thoracic spine: Secondary | ICD-10-CM | POA: Diagnosis not present

## 2018-01-05 DIAGNOSIS — M542 Cervicalgia: Secondary | ICD-10-CM | POA: Diagnosis not present

## 2018-01-05 DIAGNOSIS — M9901 Segmental and somatic dysfunction of cervical region: Secondary | ICD-10-CM | POA: Diagnosis not present

## 2018-01-05 DIAGNOSIS — M546 Pain in thoracic spine: Secondary | ICD-10-CM | POA: Diagnosis not present

## 2018-01-05 DIAGNOSIS — M9902 Segmental and somatic dysfunction of thoracic region: Secondary | ICD-10-CM | POA: Diagnosis not present

## 2018-02-06 DIAGNOSIS — M9901 Segmental and somatic dysfunction of cervical region: Secondary | ICD-10-CM | POA: Diagnosis not present

## 2018-02-06 DIAGNOSIS — M9902 Segmental and somatic dysfunction of thoracic region: Secondary | ICD-10-CM | POA: Diagnosis not present

## 2018-02-06 DIAGNOSIS — M542 Cervicalgia: Secondary | ICD-10-CM | POA: Diagnosis not present

## 2018-02-06 DIAGNOSIS — M546 Pain in thoracic spine: Secondary | ICD-10-CM | POA: Diagnosis not present

## 2018-02-08 NOTE — Progress Notes (Signed)
Medicare Annual Preventive Care Visit  (initial annual wellness or annual wellness exam)  Concerns and/or follow up today:  Hx hyperlipidemia, hyperparathyroidism (s/p surgery), hot flashes (sees gyn), vit D def here for follow up and AWV. Cholesterol had improved significantly on last check in March with lifestyle changes.  Reports continues to get regular exercise and is eating healthy.  She unfortunately is still struggling with some left shoulder pain.  She did physical therapy in the past for this which helps some.  Fortunately it has been recurrent.  She had frozen shoulder in the past.  She has pain deep in the shoulder joint with movement of the shoulder.  She feels like this sometimes radiates down her arm.  She has known degenerative disc disease in the neck.  She will be seeing Dr. Rennis Chris at [facility] orthopedics about this.  She also is seeing a Land. Declines flu vaccines.  Fasting to check her cholesterol today.  See HM section in Epic for other details of completed HM. See scanned documentation under Media Tab for further documentation HPI, health risk assessment. See Media Tab and Care Teams sections in Epic for other providers.  ROS: negative for report of fevers, unintentional weight loss, vision changes, vision loss, hearing loss or change, chest pain, sob, hemoptysis, melena, hematochezia, hematuria, genital discharge or lesions, falls, bleeding or bruising, loc, thoughts of suicide or self harm, memory loss  1.) Patient-completed health risk assessment  - completed and reviewed, see scanned documentation  2.) Review of Medical History: -PMH, PSH, Family History and current specialty and care providers reviewed and updated and listed below  - see scanned in document in chart and below  Past Medical History:  Diagnosis Date  . Family history of breast cancer    FH Burgess Amor; her genetic testing was negative for this mutation  . Frequent headaches   . History of  blood transfusion   . History of UTI   . Hyperparathyroidism [facility])    s/p surgical treatment in 2013    Past Surgical History:  Procedure Laterality Date  . ABDOMINAL HYSTERECTOMY     complete for endometriosis and fibroids  . APPENDECTOMY     w/ hysterectomy  . PARATHYROID EXPLORATION      Social History   Socioeconomic History  . Marital status: Married    Spouse name: Not on file  . Number of children: Not on file  . Years of education: Not on file  . Highest education level: Not on file  Occupational History  . Not on file  Social Needs  . Financial resource strain: Not on file  . Food insecurity:    Worry: Not on file    Inability: Not on file  . Transportation needs:    Medical: Not on file    Non-medical: Not on file  Tobacco Use  . Smoking status: Former Games developer  . Smokeless tobacco: Never Used  Substance and Sexual Activity  . Alcohol use: Not on file    Comment: 1 glass of wine daily  . Drug use: Not on file  . Sexual activity: Not on file  Lifestyle  . Physical activity:    Days per week: Not on file    Minutes per session: Not on file  . Stress: Not on file  Relationships  . Social connections:    Talks on phone: Not on file    Gets together: Not on file    Attends religious service: Not on file  Active member of club or organization: Not on file    Attends meetings of clubs or organizations: Not on file    Relationship status: Not on file  . Intimate partner violence:    Fear of current or ex partner: Not on file    Emotionally abused: Not on file    Physically abused: Not on file    Forced sexual activity: Not on file  Other Topics Concern  . Not on file  Social History Narrative   Work or School: retired [employer] Situation: lives with husband      Spiritual Beliefs: Christian      Lifestyle: regular exercise; healthy diet       Family History  Problem Relation Age of Onset  . Arthritis Father   . Hyperlipidemia  Father   . Hypertension Father   . Heart disease Father   . Breast cancer Mother   . Breast cancer Sister   . Breast cancer Maternal Aunt   . Li-Fraumeni syndrome Sister     Current Outpatient Medications on File Prior to Visit  Medication Sig Dispense Refill  . Ascorbic Acid (VITAMIN C) 1000 MG tablet Take 1,000 mg by mouth daily.    Marland Kitchen BIOTIN PO Take 100,000 mg by mouth daily.    . Calcium-Phosphorus-Vitamin D (CITRACAL +D3 PO) Take 250 mg by mouth daily.     . Cholecalciferol (VITAMIN D-3 PO) Take 5,000 Units by mouth daily.    Marland Kitchen estradiol (CLIMARA - DOSED IN MG/24 HR) 0.05 mg/24hr patch APPLY ONE PATCH TOPICALLY ONCE A WEEK 4 patch 11  . magnesium oxide (MAG-OX) 400 MG tablet Take 400 mg by mouth 2 (two) times daily.    Marland Kitchen OVER THE COUNTER MEDICATION Supplement with vitamin B6, B12 and folate    . Probiotic Product (PROBIOTIC PO) Take by mouth.     No current facility-administered medications on file prior to visit.      3.) Review of functional ability and level of safety:  Any difficulty hearing?  See scanned documentation  History of falling?  See scanned documentation  Any trouble with IADLs - using a phone, using transportation, grocery shopping, preparing meals, doing housework, doing laundry, taking medications and managing money?  See scanned documentation  Advance Directives?  Discussed briefly.  She is working on this.   See summary of recommendations in Patient Instructions below.  4.) Physical Exam Vitals:   02/09/18 1116  BP: 92/62  Pulse: 90  Temp: 98.4 F (36.9 C)   Estimated body mass index is 20.42 kg/m as calculated from the following:   Height as of this encounter: 5\' 6"  (1.676 m).   Weight as of this encounter: 126 lb 8 oz (57.4 kg).  EKG (optional): deferred  General: alert, appear well hydrated and in no acute distress  HEENT: visual acuity grossly intact, see vision screen in chart, she reports she sees a an ophthalmologist on a  regular basis, yearly  CV: HRRR  Lungs: CTA bilaterally  MS: Normal range of motion of the head and neck, no significant bony tenderness to palpation in the neck, soft tissue tenderness to palpation in the rotator cuff attachment to the humerus and left shoulder, fairly normal range of motion in the shoulder, however some restriction in abduction and extension and she does have pain with abduction above 90 degrees and positive impingement test, positive Neer's test, questionably positive speeds test, negative empty can, negative apprehension testing, neurovascularly intact distally.  Psych: pleasant and cooperative, no obvious depression or anxiety  Cognitive function grossly intact  See patient instructions for recommendations.  Education and counseling regarding the above review of health provided with a plan for the following: -see scanned patient completed form for further details -fall prevention strategies discussed  -healthy lifestyle discussed -importance and resources for completing advanced directives discussed -see patient instructions below for any other recommendations provided  4)The following written screening schedule of preventive measures were reviewed with assessment and plan made per below, orders and patient instructions:      AAA screening done if applicable     Alcohol screening done     Obesity Screening and counseling done     STI screening (Hep C if born 1947-65) offered and per pt wishes     Tobacco Screening done done       Pneumococcal (PPSV23 -one dose after 64, one before if risk factors), influenza yearly and hepatitis B vaccines (if high risk - end stage renal disease, IV drugs, homosexual men, live in home for mentally retarded, hemophilia receiving factors) ASSESSMENT/PLAN: done if applicable      Screening mammograph (yearly if >40) ASSESSMENT/PLAN: last mammo in 02/2017      Screening Pap smear/pelvic exam (q2 years) ASSESSMENT/PLAN: n/a,  declined      Colorectal cancer screening (FOBT yearly or flex sig q4y or colonoscopy q10y or barium enema q4y) ASSESSMENT/PLAN: had colonosocpy in 2012      Diabetes outpatient self-management training services ASSESSMENT/PLAN: utd or done      Bone mass measurements(covered q2y if indicated - estrogen def, osteoporosis, hyperparathyroid, vertebral abnormalities, osteoporosis or steroids) ASSESSMENT/PLAN: refused in the past      Screening for glaucoma(q1y if high risk - diabetes, FH, AA and > 50 or hispanic and > 65) ASSESSMENT/PLAN: utd or advised      Medical nutritional therapy for individuals with diabetes or renal disease ASSESSMENT/PLAN: see orders      Cardiovascular screening blood tests (lipids q5y) ASSESSMENT/PLAN: see orders and labs      Diabetes screening tests ASSESSMENT/PLAN: see orders and labs   7.) Summary:  Medicare annual wellness visit, subsequent -risk factors and conditions per above assessment were discussed and treatment, recommendations and referrals were offered per documentation above and orders and patient instructions. -Refuses vaccines  Screening for depression -See PHQ 9  Hyperlipidemia, unspecified hyperlipidemia type - Plan: Lipid panel -Lifestyle Recs  Chronic left shoulder pain -discussed various causes shoulder pain and eval. Query capsular versus labral vs other with also likely RTC issues. She also has know cervical deg disc disease so some symptoms may result from this as well. Exquisite pain in the L shoulder with some movements and did advised that during the visit with Dr. Rennis Chris would be a good idea.  She also OMM.  She can schedule this if she wishes.  Patient Instructions   BEFORE YOU LEAVE: -labs -follow up: 6 months, sooner if you wish for osteopathic treatment (OMM) for the shoulder  See Dr. Rennis Chris as planned about the shoulder pain. Let us know if you wish to do OMM as well.  We have ordered labs or studies at this  visit. It can take up to 1-2 weeks for results and processing. IF results require follow up or explanation, we will call you with instructions. Clinically stable results will be released to your [facility]. If you have not heard from [place] or cannot find your results in [facility] in 2 weeks please contact our office  at [phone].  If you are not yet signed up for [facility], please consider signing up.   Andrea Gardner , Thank you for taking time to come for your Medicare Wellness Visit. I appreciate your ongoing commitment to your health goals. Please review the following plan we discussed and let me know if I can assist you in the future.   These are the goals we discussed: Advanced Directives   This is a list of the screening recommended for you and due dates:  Health Maintenance  Topic Date Due  . Flu Shot  declined  . Pneumonia vaccines (1 of 2 - PCV13) declined  . Mammogram  03/19/2019  . Colon Cancer Screening  02/03/2021  . Tetanus Vaccine  05/30/2021  . DEXA scan (bone density measurement)  Completed  .  Hepatitis C: One time screening is recommended by Center for Disease Control  (CDC) for  adults born from 18 through 1967.   Completed  *Topic was postponed. The date shown is not the original due date.           Terressa Koyanagi, DO

## 2018-02-09 ENCOUNTER — Ambulatory Visit (INDEPENDENT_AMBULATORY_CARE_PROVIDER_SITE_OTHER): Payer: Medicare Other | Admitting: Family Medicine

## 2018-02-09 ENCOUNTER — Encounter: Payer: Self-pay | Admitting: Family Medicine

## 2018-02-09 VITALS — BP 92/62 | HR 90 | Temp 98.4°F | Ht 66.0 in | Wt 126.5 lb

## 2018-02-09 DIAGNOSIS — Z0000 Encounter for general adult medical examination without abnormal findings: Secondary | ICD-10-CM

## 2018-02-09 DIAGNOSIS — M25512 Pain in left shoulder: Secondary | ICD-10-CM

## 2018-02-09 DIAGNOSIS — G8929 Other chronic pain: Secondary | ICD-10-CM

## 2018-02-09 DIAGNOSIS — Z0001 Encounter for general adult medical examination with abnormal findings: Secondary | ICD-10-CM

## 2018-02-09 DIAGNOSIS — Z1331 Encounter for screening for depression: Secondary | ICD-10-CM | POA: Diagnosis not present

## 2018-02-09 DIAGNOSIS — E785 Hyperlipidemia, unspecified: Secondary | ICD-10-CM

## 2018-02-09 LAB — LIPID PANEL
CHOLESTEROL: 219 mg/dL — AB (ref 0–200)
HDL: 66.7 mg/dL (ref >39.00)
LDL Cholesterol: 132 mg/dL — ABNORMAL HIGH (ref 0–99)
NonHDL: 152.23
TRIGLYCERIDES: 100 mg/dL (ref 0.0–149.0)
Total CHOL/HDL Ratio: 3
VLDL: 20 mg/dL (ref 0.0–40.0)

## 2018-02-09 NOTE — Patient Instructions (Signed)
BEFORE YOU LEAVE: -labs -follow up: 6 months, sooner if you wish for osteopathic treatment (OMM) for the shoulder  See Dr. Rennis Chris as planned about the shoulder pain. Let us know if you wish to do OMM as well.  We have ordered labs or studies at this visit. It can take up to 1-2 weeks for results and processing. IF results require follow up or explanation, we will call you with instructions. Clinically stable results will be released to your [facility]. If you have not heard from [place] or cannot find your results in [facility] in 2 weeks please contact our office at [phone].  If you are not yet signed up for [facility], please consider signing up.   Andrea Gardner , Thank you for taking time to come for your Medicare Wellness Visit. I appreciate your ongoing commitment to your health goals. Please review the following plan we discussed and let me know if I can assist you in the future.   These are the goals we discussed: Advanced Directives   This is a list of the screening recommended for you and due dates:  Health Maintenance  Topic Date Due  . Flu Shot  declined  . Pneumonia vaccines (1 of 2 - PCV13) declined  . Mammogram  03/19/2019  . Colon Cancer Screening  02/03/2021  . Tetanus Vaccine  05/30/2021  . DEXA scan (bone density measurement)  Completed  .  Hepatitis C: One time screening is recommended by Center for Disease Control  (CDC) for  adults born from [birth year] through 1967.   Completed  *Topic was postponed. The date shown is not the original due date.

## 2018-02-17 DIAGNOSIS — M25579 Pain in unspecified ankle and joints of unspecified foot: Secondary | ICD-10-CM | POA: Diagnosis not present

## 2018-02-17 DIAGNOSIS — M79672 Pain in left foot: Secondary | ICD-10-CM | POA: Diagnosis not present

## 2018-02-18 DIAGNOSIS — M25512 Pain in left shoulder: Secondary | ICD-10-CM | POA: Diagnosis not present

## 2018-02-25 ENCOUNTER — Telehealth: Payer: Self-pay

## 2018-02-25 NOTE — Telephone Encounter (Signed)
I called the pt as I did not see an order for an MRI in our system that was placed by Dr Selena Batten.  She stated Dr Rennis Chris ordered the MRI and I advised she contact his office with this request and she agreed.

## 2018-02-25 NOTE — Telephone Encounter (Signed)
Copied from CRM [id number]. Topic: General - Other >> Feb 25, 2018  1:14 PM Ronney Lion A wrote: Reason for CRM: pt called in wanting to know if she can receive medication before her MRI, she says it will help with her anxiety  and claustrophobia..  Please advise

## 2018-02-28 DIAGNOSIS — M25512 Pain in left shoulder: Secondary | ICD-10-CM | POA: Diagnosis not present

## 2018-03-04 DIAGNOSIS — M25512 Pain in left shoulder: Secondary | ICD-10-CM | POA: Diagnosis not present

## 2018-03-04 DIAGNOSIS — M7502 Adhesive capsulitis of left shoulder: Secondary | ICD-10-CM | POA: Diagnosis not present

## 2018-03-13 ENCOUNTER — Other Ambulatory Visit: Payer: Self-pay

## 2018-03-31 ENCOUNTER — Other Ambulatory Visit: Payer: Self-pay | Admitting: Family Medicine

## 2018-04-15 DIAGNOSIS — M25512 Pain in left shoulder: Secondary | ICD-10-CM | POA: Diagnosis not present

## 2018-04-30 DIAGNOSIS — R51 Headache: Secondary | ICD-10-CM | POA: Diagnosis not present

## 2018-04-30 DIAGNOSIS — J019 Acute sinusitis, unspecified: Secondary | ICD-10-CM | POA: Diagnosis not present

## 2018-05-26 DIAGNOSIS — E8941 Symptomatic postprocedural ovarian failure: Secondary | ICD-10-CM | POA: Diagnosis not present

## 2018-05-26 DIAGNOSIS — M8580 Other specified disorders of bone density and structure, unspecified site: Secondary | ICD-10-CM | POA: Diagnosis not present

## 2018-05-26 DIAGNOSIS — Z79890 Hormone replacement therapy (postmenopausal): Secondary | ICD-10-CM | POA: Diagnosis not present

## 2018-05-26 DIAGNOSIS — Z1231 Encounter for screening mammogram for malignant neoplasm of breast: Secondary | ICD-10-CM | POA: Diagnosis not present

## 2018-05-28 ENCOUNTER — Ambulatory Visit (INDEPENDENT_AMBULATORY_CARE_PROVIDER_SITE_OTHER): Payer: Medicare Other | Admitting: Family Medicine

## 2018-05-28 ENCOUNTER — Encounter: Payer: Self-pay | Admitting: Family Medicine

## 2018-05-28 VITALS — BP 104/60 | HR 85 | Temp 97.8°F | Ht 66.0 in | Wt 128.3 lb

## 2018-05-28 DIAGNOSIS — M9908 Segmental and somatic dysfunction of rib cage: Secondary | ICD-10-CM

## 2018-05-28 DIAGNOSIS — M542 Cervicalgia: Secondary | ICD-10-CM | POA: Diagnosis not present

## 2018-05-28 DIAGNOSIS — M25512 Pain in left shoulder: Secondary | ICD-10-CM | POA: Diagnosis not present

## 2018-05-28 DIAGNOSIS — M9901 Segmental and somatic dysfunction of cervical region: Secondary | ICD-10-CM

## 2018-05-28 DIAGNOSIS — M9902 Segmental and somatic dysfunction of thoracic region: Secondary | ICD-10-CM

## 2018-05-28 DIAGNOSIS — M9903 Segmental and somatic dysfunction of lumbar region: Secondary | ICD-10-CM | POA: Diagnosis not present

## 2018-05-28 DIAGNOSIS — G8929 Other chronic pain: Secondary | ICD-10-CM

## 2018-05-28 DIAGNOSIS — M9905 Segmental and somatic dysfunction of pelvic region: Secondary | ICD-10-CM

## 2018-05-28 DIAGNOSIS — M25511 Pain in right shoulder: Secondary | ICD-10-CM

## 2018-05-28 DIAGNOSIS — M549 Dorsalgia, unspecified: Secondary | ICD-10-CM | POA: Diagnosis not present

## 2018-05-28 MED ORDER — TIZANIDINE HCL 2 MG PO TABS: 2.0000 mg | ORAL_TABLET | Freq: Two times a day (BID) | ORAL | 0 refills | Status: DC | PRN

## 2018-05-28 NOTE — Patient Instructions (Signed)
BEFORE YOU LEAVE: -upper back exercises -follow up: OMM 1-3 weeks  Alternate the back and the neck exercises.  Use the tizanidine (muscle relaxer) per instructions as needed.  Heat and topical menthol (tiger balm) if needed for pain. Tylenol [phone] mg ok up to twice daily.  Do the postural neck strengthening exercises daily: -stand with heals, buttocks, shoulder blades and head against wall -press back against wall gently with your head for 5 seconds, then against counter pressure with finger tips: to the right for 5 seconds, forward for 5 seconds, then to the left for 5 second (keep head straight and against wall the entire time) -repeat 10 times  If worsening or not improving please contact your orthopedic specialist promptly for re-evaluation.

## 2018-05-28 NOTE — Progress Notes (Signed)
HPI:  Using dictation device. Unfortunately this device frequently misinterprets words/phrases.  Acute visit for bilat shoulder pain, neck pain, upper back pain: -intermittent issues for years, but this started a bout 3 weeks ago -can't think of inciting event -mod-severe dull pain in bilat lat shoulders R>L, traps in upper back/neck -worse with cleaning or up body activities -better with walking -tylenol helps minimally -saw her orthopedic doctor last month and had MRI of the L shoulder and was told was ok -hx frozen shoulder, DDD neck -denies fevers, malaise, weakness, numbness, illness - other then did have sinusitis, treated and doing better -mild deg changes cervical spine plain films 1/20119 -sees orthopedic specialist, [place] [facility] ROS: See pertinent positives and negatives per HPI.  Past Medical History:  Diagnosis Date  . Family history of breast cancer    FH Burgess AmorLi Fraumeni; her genetic testing was negative for this mutation  . Frequent headaches   . History of blood transfusion   . History of UTI   . Hyperparathyroidism [facility](HCC)    s/p surgical treatment in 2013    Past Surgical History:  Procedure Laterality Date  . ABDOMINAL HYSTERECTOMY     complete for endometriosis and fibroids  . APPENDECTOMY     w/ hysterectomy  . PARATHYROID EXPLORATION      Family History  Problem Relation Age of Onset  . Arthritis Father   . Hyperlipidemia Father   . Hypertension Father   . Heart disease Father   . Breast cancer Mother   . Breast cancer Sister   . Breast cancer Maternal Aunt   . Li-Fraumeni syndrome Sister     SOCIAL HX: se ehpi   Current Outpatient Medications:  .  Ascorbic Acid (VITAMIN C) 1000 MG tablet, Take 1,000 mg by mouth daily., Disp: , Rfl:  .  BIOTIN PO, Take 100,000 mg by mouth daily., Disp: , Rfl:  .  Calcium-Phosphorus-Vitamin D (CITRACAL +D3 PO), Take 250 mg by mouth daily. , Disp: , Rfl:  .  Cholecalciferol (VITAMIN D-3 PO), Take 5,000 Units by  mouth daily., Disp: , Rfl:  .  estradiol (CLIMARA - DOSED IN MG/24 HR) 0.05 mg/24hr patch, APPLY 1 PATCH EVERY WEEK, Disp: 4 patch, Rfl: 8 .  magnesium oxide (MAG-OX) 400 MG tablet, Take 400 mg by mouth 2 (two) times daily., Disp: , Rfl:  .  OVER THE COUNTER MEDICATION, Supplement with vitamin B6, B12 and folate, Disp: , Rfl:  .  Probiotic Product (PROBIOTIC PO), Take by mouth., Disp: , Rfl:  .  tiZANidine (ZANAFLEX) 2 MG tablet, Take 1 tablet (2 mg total) by mouth every 12 (twelve) hours as needed for muscle spasms., Disp: 20 tablet, Rfl: 0  EXAM:  Vitals:   05/28/18 1050  BP: 104/60  Pulse: 85  Temp: 97.8 F (36.6 C)    Body mass index is 20.71 kg/m.  GENERAL: vitals reviewed and listed above, alert, oriented, appears well hydrated and in no acute distress  HEENT: atraumatic, conjunttiva clear, no obvious abnormalities on inspection of external nose and ears  NECK: no obvious masses on inspection  MS: moves all extremities without noticeable abnormality, on inspection has head forward posture and L shoulder > R, normal strength throughout in the upper extremities/shoulder bilaterally, normal sensitivity to light touch and DTRs in the upper extremities bilat, TTP in the supraspinatus muscle belly on the R with tender point, TTP in the bilat upper traps with tender points, pain with elevation of the R shoulder above 90 degrees with +  impingement test, neg empty can, neg spurling, neg vertebral art insuff testing. T 4-6 RLSr, L 4-5 RrSl, R ant inn, TO Rr, TI Rr, C3-4FRrSr, suboccipital muscle tension bilat  PSYCH: pleasant and cooperative, no obvious depression or anxiety  ASSESSMENT AND PLAN:  Discussed the following assessment and plan:  Chronic pain of both shoulders  Neck pain  Upper back pain  Somatic dysfunction of cervical region  Somatic dysfunction of thoracic region  Somatic dysfunction of lumbar region  Somatic dysfunction of pelvis region  Somatic  dysfunction of rib cage region  -we discussed possible serious and likely etiologies, workup and treatment, treatment risks and return precautions - discussed possibilities of muscle spasm, cervical DDD, shoulder pathologies, somatic dysfunction and other.  -after this discussion, Nedda opted for OMM, home exercises, trial muscle relaxer, symptomatic control per below and ortho re-eval if worsening or not improving. -follow up advised 1-3 weeks -of course, we advised Effy  to return or notify a doctor immediately if symptoms worsen or persist or new concerns arise.   Patient Instructions  BEFORE YOU LEAVE: -upper back exercises -follow up: OMM 1-3 weeks  Alternate the back and the neck exercises.  Use the tizanidine (muscle relaxer) per instructions as needed.  Heat and topical menthol (tiger balm) if needed for pain. Tylenol [phone] ok up to twice daily.  Do the postural neck strengthening exercises daily: -stand with heals, buttocks, shoulder blades and head against wall -press back against wall gently with your head for 5 seconds, then against counter pressure with finger tips: to the right for 5 seconds, forward for 5 seconds, then to the left for 5 second (keep head straight and against wall the entire time) -repeat 10 times  If worsening or not improving please contact your orthopedic specialist promptly for re-evaluation.   Terressa Koyanagi, DO

## 2018-06-09 ENCOUNTER — Encounter: Payer: Self-pay | Admitting: Family Medicine

## 2018-06-09 ENCOUNTER — Ambulatory Visit (INDEPENDENT_AMBULATORY_CARE_PROVIDER_SITE_OTHER): Payer: Medicare Other | Admitting: Family Medicine

## 2018-06-09 VITALS — BP 120/70 | HR 80 | Temp 98.4°F | Ht 66.0 in

## 2018-06-09 DIAGNOSIS — M546 Pain in thoracic spine: Secondary | ICD-10-CM

## 2018-06-09 DIAGNOSIS — M25512 Pain in left shoulder: Secondary | ICD-10-CM | POA: Diagnosis not present

## 2018-06-09 DIAGNOSIS — M25511 Pain in right shoulder: Secondary | ICD-10-CM | POA: Diagnosis not present

## 2018-06-09 DIAGNOSIS — M9902 Segmental and somatic dysfunction of thoracic region: Secondary | ICD-10-CM | POA: Diagnosis not present

## 2018-06-09 DIAGNOSIS — G8929 Other chronic pain: Secondary | ICD-10-CM | POA: Diagnosis not present

## 2018-06-09 DIAGNOSIS — M9901 Segmental and somatic dysfunction of cervical region: Secondary | ICD-10-CM | POA: Diagnosis not present

## 2018-06-09 DIAGNOSIS — M9905 Segmental and somatic dysfunction of pelvic region: Secondary | ICD-10-CM | POA: Diagnosis not present

## 2018-06-09 DIAGNOSIS — M9907 Segmental and somatic dysfunction of upper extremity: Secondary | ICD-10-CM | POA: Diagnosis not present

## 2018-06-09 NOTE — Patient Instructions (Addendum)
Please call your orthopedic specialist about the shoulder today for appointment for evaluation.  Exercises provided as able 4 days per week.  Can use the muscle relaxer and or heat, topical menthol and tylenol per instruction only if needed in the meantime.

## 2018-06-09 NOTE — Progress Notes (Signed)
HPI:  Using dictation device. Unfortunately this device frequently misinterprets words/phrases.  Follow up bilateral shoulder pain: -reports: R lat shoulder pain worsening and after lifting arm to try to hug her nephew a few days ago sig pain with abd/flex, int rot and restriction, some upper back pain she thinks worsened with the HEP postural exercise - so she only did them once, L shoulder improved -hot showers, tizanidine and tylenol help symptoms -denies: weakness, numbness, radiation to arms or hands, malaise, fevers  History (see initial visit for this 05/28/18) -intermittent issues for years, but this started a flare in early January 2020, no inciting event -initially reported: mod-severe dull pain in bilat lat shoulders R>L, traps in upper back/neck, worse with cleaning or upper body activities; better with walking, tylenol - at first visit tried HEP, tizanidine, symptomatic care and OMM per her preference -reported saw her orthopedic doctor in December 19 (Dr. Rennis Chris) and had MRI of the L shoulder and was told was ok -hx frozen shoulder, DDD neck -denies fevers, malaise, weakness, numbness, illness  -mild deg changes cervical spine plain films 04/2017 -sees orthopedic specialist, [facility] [facility], Dr. Rennis Chris  ROS: See pertinent positives and negatives per HPI.  Past Medical History:  Diagnosis Date  . Family history of breast cancer    FH Andrea Gardner; her genetic testing was negative for this mutation  . Frequent headaches   . History of blood transfusion   . History of UTI   . Hyperparathyroidism [facility])    s/p surgical treatment in 2013    Past Surgical History:  Procedure Laterality Date  . ABDOMINAL HYSTERECTOMY     complete for endometriosis and fibroids  . APPENDECTOMY     w/ hysterectomy  . PARATHYROID EXPLORATION      Family History  Problem Relation Age of Onset  . Arthritis Father   . Hyperlipidemia Father   . Hypertension Father   . Heart disease Father   .  Breast cancer Mother   . Breast cancer Sister   . Breast cancer Maternal Aunt   . Li-Fraumeni syndrome Sister     SOCIAL HX: see h[i   Current Outpatient Medications:  .  Ascorbic Acid (VITAMIN C) 1000 MG tablet, Take 1,000 mg by mouth daily., Disp: , Rfl:  .  BIOTIN PO, Take 100,000 mg by mouth daily., Disp: , Rfl:  .  Calcium-Phosphorus-Vitamin D (CITRACAL +D3 PO), Take 250 mg by mouth daily. , Disp: , Rfl:  .  Cholecalciferol (VITAMIN D-3 PO), Take 5,000 Units by mouth daily., Disp: , Rfl:  .  estradiol (CLIMARA - DOSED IN MG/24 HR) 0.05 mg/24hr patch, APPLY 1 PATCH EVERY WEEK, Disp: 4 patch, Rfl: 8 .  magnesium oxide (MAG-OX) 400 MG tablet, Take 400 mg by mouth 2 (two) times daily., Disp: , Rfl:  .  OVER THE COUNTER MEDICATION, Supplement with vitamin B6, B12 and folate, Disp: , Rfl:  .  Probiotic Product (PROBIOTIC PO), Take by mouth., Disp: , Rfl:  .  tiZANidine (ZANAFLEX) 2 MG tablet, Take 1 tablet (2 mg total) by mouth every 12 (twelve) hours as needed for muscle spasms., Disp: 20 tablet, Rfl: 0  EXAM:  Vitals:   06/09/18 1104  BP: 120/70  Pulse: 80  Temp: 98.4 F (36.9 C)    Body mass index is 20.71 kg/m.  GENERAL: vitals reviewed and listed above, alert, oriented, appears well hydrated and in no acute distress  HEENT: atraumatic, conjunttiva clear, no obvious abnormalities on inspection of external nose  and ears  NECK: no obvious masses on inspection - see below  MS: moves all extremities without noticeable abnormality head forward posture, R shoulder elevated today, TTP in R lat shoulder, decreased ROM in rotation head and neck to the L, Decreased ROM R shoulder in abd/flex/int rot, + impingement testing R shoulder, strength fairly preserved in all shoulder movement - but many movement cause pain, TTP in the subocc muscles bilat, rhomboids bilat, no bony TTP in the cervical spine, neg Spurling, neg vert art insuff testing, suboccipital muscle tension, tenderness, C  4-5 FRlSl, TI Rl, T 4-7 RrSr, T 5 post TP on R, R ant inn  PSYCH: pleasant and cooperative, no obvious depression or anxiety  ASSESSMENT AND PLAN:  Discussed the following assessment and plan:  Chronic right shoulder pain  Chronic left shoulder pain  Chronic bilateral thoracic back pain  Cervical (neck) region somatic dysfunction  Somatic dysfunction of thoracic region  Upper extremity somatic dysfunction  Somatic dysfunction of pelvic region  -we discussed possible serious and likely etiologies, workup and treatment, treatment risks and return precautions; R soulder today unfortunately worse with restriction and pain - query frozen shoudler developing vs RTC injury and advised evaluation with her orthopedic specialist and may need MRI.  -after this discussion, Andrea Gardner opted for agrees to call ortho for appt, symptomatic care in interim, HEP for frozen shoulde rprovided, she also wanted to do OMM again today - see below -follow up advised pending ortho eval and/or as needed -of course, we advised Andrea Gardner  to return or notify a doctor immediately if symptoms worsen or persist or new concerns arise.  PROCEDURE NOTE : OSTEOPATHIC TREATMENT The decision today to treat with gentle Osteopathic Manipulative Therapy  (OMT) was based on physical exam findings, diagnoses and patient wishes. Verbal consent was obtained after after explanation of risks and benefits. No Cervical HVLA manipulation was performed.       Regions treated:  Cervical, thoracic, ext, pelvic     Techniques used: counterstrain, MR, BLT, ME The patient tolerated the treatment well and reported Improved  symptoms following some treatments today.   Patient Instructions  Please call your orthopedic specialist about the shoulder today for appointment for evaluation.  Exercises provided as able 4 days per week.  Can use the muscle relaxer and or heat, topical menthol and tylenol per instruction only if  needed in the meantime.   Andrea Koyanagi, DO

## 2018-06-29 DIAGNOSIS — M7501 Adhesive capsulitis of right shoulder: Secondary | ICD-10-CM | POA: Diagnosis not present

## 2018-06-29 DIAGNOSIS — M25511 Pain in right shoulder: Secondary | ICD-10-CM | POA: Diagnosis not present

## 2018-07-03 DIAGNOSIS — M25611 Stiffness of right shoulder, not elsewhere classified: Secondary | ICD-10-CM | POA: Diagnosis not present

## 2018-07-03 DIAGNOSIS — M25511 Pain in right shoulder: Secondary | ICD-10-CM | POA: Diagnosis not present

## 2018-07-03 DIAGNOSIS — M7501 Adhesive capsulitis of right shoulder: Secondary | ICD-10-CM | POA: Diagnosis not present

## 2018-07-06 DIAGNOSIS — M25511 Pain in right shoulder: Secondary | ICD-10-CM | POA: Diagnosis not present

## 2018-07-06 DIAGNOSIS — M7501 Adhesive capsulitis of right shoulder: Secondary | ICD-10-CM | POA: Diagnosis not present

## 2018-07-06 DIAGNOSIS — M25611 Stiffness of right shoulder, not elsewhere classified: Secondary | ICD-10-CM | POA: Diagnosis not present

## 2018-07-08 DIAGNOSIS — M25511 Pain in right shoulder: Secondary | ICD-10-CM | POA: Diagnosis not present

## 2018-07-08 DIAGNOSIS — M25611 Stiffness of right shoulder, not elsewhere classified: Secondary | ICD-10-CM | POA: Diagnosis not present

## 2018-07-08 DIAGNOSIS — M7501 Adhesive capsulitis of right shoulder: Secondary | ICD-10-CM | POA: Diagnosis not present

## 2018-07-10 DIAGNOSIS — M25511 Pain in right shoulder: Secondary | ICD-10-CM | POA: Diagnosis not present

## 2018-07-10 DIAGNOSIS — M25611 Stiffness of right shoulder, not elsewhere classified: Secondary | ICD-10-CM | POA: Diagnosis not present

## 2018-07-10 DIAGNOSIS — M7501 Adhesive capsulitis of right shoulder: Secondary | ICD-10-CM | POA: Diagnosis not present

## 2018-07-13 DIAGNOSIS — M7501 Adhesive capsulitis of right shoulder: Secondary | ICD-10-CM | POA: Diagnosis not present

## 2018-07-13 DIAGNOSIS — M25611 Stiffness of right shoulder, not elsewhere classified: Secondary | ICD-10-CM | POA: Diagnosis not present

## 2018-07-13 DIAGNOSIS — M25511 Pain in right shoulder: Secondary | ICD-10-CM | POA: Diagnosis not present

## 2018-07-15 DIAGNOSIS — M25511 Pain in right shoulder: Secondary | ICD-10-CM | POA: Diagnosis not present

## 2018-07-15 DIAGNOSIS — M25611 Stiffness of right shoulder, not elsewhere classified: Secondary | ICD-10-CM | POA: Diagnosis not present

## 2018-07-15 DIAGNOSIS — M7501 Adhesive capsulitis of right shoulder: Secondary | ICD-10-CM | POA: Diagnosis not present

## 2018-07-17 DIAGNOSIS — M25511 Pain in right shoulder: Secondary | ICD-10-CM | POA: Diagnosis not present

## 2018-07-17 DIAGNOSIS — M25611 Stiffness of right shoulder, not elsewhere classified: Secondary | ICD-10-CM | POA: Diagnosis not present

## 2018-07-17 DIAGNOSIS — M7501 Adhesive capsulitis of right shoulder: Secondary | ICD-10-CM | POA: Diagnosis not present

## 2018-07-20 DIAGNOSIS — M7501 Adhesive capsulitis of right shoulder: Secondary | ICD-10-CM | POA: Diagnosis not present

## 2018-07-20 DIAGNOSIS — M25511 Pain in right shoulder: Secondary | ICD-10-CM | POA: Diagnosis not present

## 2018-07-20 DIAGNOSIS — M25611 Stiffness of right shoulder, not elsewhere classified: Secondary | ICD-10-CM | POA: Diagnosis not present

## 2018-07-22 DIAGNOSIS — M25611 Stiffness of right shoulder, not elsewhere classified: Secondary | ICD-10-CM | POA: Diagnosis not present

## 2018-07-22 DIAGNOSIS — M7501 Adhesive capsulitis of right shoulder: Secondary | ICD-10-CM | POA: Diagnosis not present

## 2018-07-22 DIAGNOSIS — M25511 Pain in right shoulder: Secondary | ICD-10-CM | POA: Diagnosis not present

## 2018-07-24 DIAGNOSIS — M7501 Adhesive capsulitis of right shoulder: Secondary | ICD-10-CM | POA: Diagnosis not present

## 2018-07-24 DIAGNOSIS — M25511 Pain in right shoulder: Secondary | ICD-10-CM | POA: Diagnosis not present

## 2018-07-24 DIAGNOSIS — M25611 Stiffness of right shoulder, not elsewhere classified: Secondary | ICD-10-CM | POA: Diagnosis not present

## 2018-07-27 DIAGNOSIS — M25511 Pain in right shoulder: Secondary | ICD-10-CM | POA: Diagnosis not present

## 2018-07-27 DIAGNOSIS — M7501 Adhesive capsulitis of right shoulder: Secondary | ICD-10-CM | POA: Diagnosis not present

## 2018-07-27 DIAGNOSIS — M25611 Stiffness of right shoulder, not elsewhere classified: Secondary | ICD-10-CM | POA: Diagnosis not present

## 2018-07-29 DIAGNOSIS — M25611 Stiffness of right shoulder, not elsewhere classified: Secondary | ICD-10-CM | POA: Diagnosis not present

## 2018-07-29 DIAGNOSIS — M7501 Adhesive capsulitis of right shoulder: Secondary | ICD-10-CM | POA: Diagnosis not present

## 2018-07-29 DIAGNOSIS — M25511 Pain in right shoulder: Secondary | ICD-10-CM | POA: Diagnosis not present

## 2018-07-31 DIAGNOSIS — M7501 Adhesive capsulitis of right shoulder: Secondary | ICD-10-CM | POA: Diagnosis not present

## 2018-07-31 DIAGNOSIS — M25511 Pain in right shoulder: Secondary | ICD-10-CM | POA: Diagnosis not present

## 2018-07-31 DIAGNOSIS — M25611 Stiffness of right shoulder, not elsewhere classified: Secondary | ICD-10-CM | POA: Diagnosis not present

## 2018-08-03 DIAGNOSIS — M7501 Adhesive capsulitis of right shoulder: Secondary | ICD-10-CM | POA: Diagnosis not present

## 2018-08-03 DIAGNOSIS — M25511 Pain in right shoulder: Secondary | ICD-10-CM | POA: Diagnosis not present

## 2018-08-03 DIAGNOSIS — M25611 Stiffness of right shoulder, not elsewhere classified: Secondary | ICD-10-CM | POA: Diagnosis not present

## 2018-08-05 DIAGNOSIS — M25511 Pain in right shoulder: Secondary | ICD-10-CM | POA: Diagnosis not present

## 2018-08-05 DIAGNOSIS — M7501 Adhesive capsulitis of right shoulder: Secondary | ICD-10-CM | POA: Diagnosis not present

## 2018-08-05 DIAGNOSIS — M25611 Stiffness of right shoulder, not elsewhere classified: Secondary | ICD-10-CM | POA: Diagnosis not present

## 2018-08-06 DIAGNOSIS — M7501 Adhesive capsulitis of right shoulder: Secondary | ICD-10-CM | POA: Diagnosis not present

## 2018-08-06 DIAGNOSIS — M25611 Stiffness of right shoulder, not elsewhere classified: Secondary | ICD-10-CM | POA: Diagnosis not present

## 2018-08-06 DIAGNOSIS — M25511 Pain in right shoulder: Secondary | ICD-10-CM | POA: Diagnosis not present

## 2018-08-10 ENCOUNTER — Telehealth: Payer: Self-pay | Admitting: *Deleted

## 2018-08-10 DIAGNOSIS — M7501 Adhesive capsulitis of right shoulder: Secondary | ICD-10-CM | POA: Diagnosis not present

## 2018-08-10 DIAGNOSIS — R253 Fasciculation: Secondary | ICD-10-CM | POA: Diagnosis not present

## 2018-08-10 DIAGNOSIS — M25511 Pain in right shoulder: Secondary | ICD-10-CM | POA: Diagnosis not present

## 2018-08-10 DIAGNOSIS — M25611 Stiffness of right shoulder, not elsewhere classified: Secondary | ICD-10-CM | POA: Diagnosis not present

## 2018-08-10 NOTE — Telephone Encounter (Signed)
I called the pt and informed her Dr Selena Batten is not seeing any patients in the office at this time and offered a visit via [email or web address].  Patient stated to cancel the appt and she will call back for a transfer of care visit.

## 2018-08-10 NOTE — Telephone Encounter (Signed)
Copied from CRM [id number]. Topic: Appointment Scheduling - Scheduling Inquiry for Clinic >> Aug 10, 2018  1:16 PM Baldo Daub L wrote: Reason for CRM:   Pt called and left message on [facility] mailbox.  States that she needs to reschedule her appointment for tomorrow.

## 2018-08-11 ENCOUNTER — Ambulatory Visit: Payer: Medicare Other | Admitting: Family Medicine

## 2018-08-12 DIAGNOSIS — M7501 Adhesive capsulitis of right shoulder: Secondary | ICD-10-CM | POA: Diagnosis not present

## 2018-08-12 DIAGNOSIS — M25611 Stiffness of right shoulder, not elsewhere classified: Secondary | ICD-10-CM | POA: Diagnosis not present

## 2018-08-12 DIAGNOSIS — M25511 Pain in right shoulder: Secondary | ICD-10-CM | POA: Diagnosis not present

## 2018-08-13 DIAGNOSIS — M25611 Stiffness of right shoulder, not elsewhere classified: Secondary | ICD-10-CM | POA: Diagnosis not present

## 2018-08-13 DIAGNOSIS — M7501 Adhesive capsulitis of right shoulder: Secondary | ICD-10-CM | POA: Diagnosis not present

## 2018-08-13 DIAGNOSIS — M25511 Pain in right shoulder: Secondary | ICD-10-CM | POA: Diagnosis not present

## 2018-08-17 DIAGNOSIS — M25511 Pain in right shoulder: Secondary | ICD-10-CM | POA: Diagnosis not present

## 2018-08-17 DIAGNOSIS — M25611 Stiffness of right shoulder, not elsewhere classified: Secondary | ICD-10-CM | POA: Diagnosis not present

## 2018-08-17 DIAGNOSIS — M7501 Adhesive capsulitis of right shoulder: Secondary | ICD-10-CM | POA: Diagnosis not present

## 2018-08-19 DIAGNOSIS — M7501 Adhesive capsulitis of right shoulder: Secondary | ICD-10-CM | POA: Diagnosis not present

## 2018-08-19 DIAGNOSIS — M25511 Pain in right shoulder: Secondary | ICD-10-CM | POA: Diagnosis not present

## 2018-08-19 DIAGNOSIS — M25611 Stiffness of right shoulder, not elsewhere classified: Secondary | ICD-10-CM | POA: Diagnosis not present

## 2018-08-20 DIAGNOSIS — M25611 Stiffness of right shoulder, not elsewhere classified: Secondary | ICD-10-CM | POA: Diagnosis not present

## 2018-08-20 DIAGNOSIS — M7501 Adhesive capsulitis of right shoulder: Secondary | ICD-10-CM | POA: Diagnosis not present

## 2018-08-20 DIAGNOSIS — M25511 Pain in right shoulder: Secondary | ICD-10-CM | POA: Diagnosis not present

## 2018-08-24 DIAGNOSIS — M7501 Adhesive capsulitis of right shoulder: Secondary | ICD-10-CM | POA: Diagnosis not present

## 2018-08-24 DIAGNOSIS — M25611 Stiffness of right shoulder, not elsewhere classified: Secondary | ICD-10-CM | POA: Diagnosis not present

## 2018-08-24 DIAGNOSIS — M25511 Pain in right shoulder: Secondary | ICD-10-CM | POA: Diagnosis not present

## 2018-08-26 DIAGNOSIS — M7501 Adhesive capsulitis of right shoulder: Secondary | ICD-10-CM | POA: Diagnosis not present

## 2018-08-26 DIAGNOSIS — M25511 Pain in right shoulder: Secondary | ICD-10-CM | POA: Diagnosis not present

## 2018-08-26 DIAGNOSIS — M25611 Stiffness of right shoulder, not elsewhere classified: Secondary | ICD-10-CM | POA: Diagnosis not present

## 2018-08-27 DIAGNOSIS — M7501 Adhesive capsulitis of right shoulder: Secondary | ICD-10-CM | POA: Diagnosis not present

## 2018-08-27 DIAGNOSIS — M25611 Stiffness of right shoulder, not elsewhere classified: Secondary | ICD-10-CM | POA: Diagnosis not present

## 2018-08-27 DIAGNOSIS — M25511 Pain in right shoulder: Secondary | ICD-10-CM | POA: Diagnosis not present

## 2018-08-31 DIAGNOSIS — M25611 Stiffness of right shoulder, not elsewhere classified: Secondary | ICD-10-CM | POA: Diagnosis not present

## 2018-08-31 DIAGNOSIS — M7501 Adhesive capsulitis of right shoulder: Secondary | ICD-10-CM | POA: Diagnosis not present

## 2018-08-31 DIAGNOSIS — M25511 Pain in right shoulder: Secondary | ICD-10-CM | POA: Diagnosis not present

## 2018-09-02 DIAGNOSIS — M25611 Stiffness of right shoulder, not elsewhere classified: Secondary | ICD-10-CM | POA: Diagnosis not present

## 2018-09-02 DIAGNOSIS — M25511 Pain in right shoulder: Secondary | ICD-10-CM | POA: Diagnosis not present

## 2018-09-02 DIAGNOSIS — M7501 Adhesive capsulitis of right shoulder: Secondary | ICD-10-CM | POA: Diagnosis not present

## 2018-09-03 DIAGNOSIS — M25511 Pain in right shoulder: Secondary | ICD-10-CM | POA: Diagnosis not present

## 2018-09-03 DIAGNOSIS — M25611 Stiffness of right shoulder, not elsewhere classified: Secondary | ICD-10-CM | POA: Diagnosis not present

## 2018-09-03 DIAGNOSIS — M7501 Adhesive capsulitis of right shoulder: Secondary | ICD-10-CM | POA: Diagnosis not present

## 2018-09-07 ENCOUNTER — Other Ambulatory Visit: Payer: Self-pay

## 2018-09-07 ENCOUNTER — Ambulatory Visit (INDEPENDENT_AMBULATORY_CARE_PROVIDER_SITE_OTHER): Payer: Medicare Other | Admitting: Neurology

## 2018-09-07 ENCOUNTER — Telehealth: Payer: Self-pay | Admitting: Neurology

## 2018-09-07 ENCOUNTER — Encounter: Payer: Self-pay | Admitting: Neurology

## 2018-09-07 DIAGNOSIS — R29898 Other symptoms and signs involving the musculoskeletal system: Secondary | ICD-10-CM | POA: Insufficient documentation

## 2018-09-07 DIAGNOSIS — M542 Cervicalgia: Secondary | ICD-10-CM | POA: Diagnosis not present

## 2018-09-07 DIAGNOSIS — R202 Paresthesia of skin: Secondary | ICD-10-CM

## 2018-09-07 MED ORDER — DIAZEPAM 5 MG PO TABS: 5.0000 mg | ORAL_TABLET | ORAL | 0 refills | Status: AC | PRN

## 2018-09-07 NOTE — Progress Notes (Signed)
PATIENT: Andrea Gardner DOB: [birth year]  Virtual Visit via video  I connected with Maris Berger on 09/07/18 at  by video and verified that I am speaking with the correct person using two identifiers.   I discussed the limitations, risks, security and privacy concerns of performing an evaluation and management service by video and the availability of in person appointments. I also discussed with the patient that there may be a patient responsible charge related to this service. The patient expressed understanding and agreed to proceed.  HISTORICAL  Andrea Gardner is a 68 years old female, seen in request by Dr. Rennis Chris, Caryn Bee for evaluation of right arm pain  I have reviewed and summarized the referring note from the referring physician.  She was previously healthy, around January 2020, without clear triggers, she noticed right arm deep achy pain, from right shoulder extending to right deltoid region, also difficulty raising right arm overhead, difficulty opening jaw with his right hand, numbness involving all 5 fingers, limited range of motion of her right arm, also complains of neck pain, previously was diagnosed frozen shoulder on the left side, but this time no significant left-sided involvement  She has no difficulty walking, no bowel and bladder incontinence, no visual loss, no dysarthria,  She has tried physical therapy recently, which has been helpful   Observations/Objective: I have reviewed problem lists, medications, allergies. Awake alert oriented to history taking care of conversation facial were symmetric, she has difficulty raising right arm overhead, limited range of motion of right shoulder, she can move left arm without difficulty, ambulate without difficulty   Assessment and Plan: New onset neck pain, right arm weakness  Need to rule out right cervical radiculopathy, differentiation diagnoses also including right brachial plexopathy  Proceed with MRI of  cervical spine  EMG nerve conduction study  Follow Up Instructions:  6 months    I discussed the assessment and treatment plan with the patient. The patient was provided an opportunity to ask questions and all were answered. The patient agreed with the plan and demonstrated an understanding of the instructions.   The patient was advised to call back or seek an in-person evaluation if the symptoms worsen or if the condition fails to improve as anticipated.  I provided 30 minutes of non-face-to-face time during this encounter.  REVIEW OF SYSTEMS: Full 14 system review of systems performed and notable only for as above All other review of systems were negative.  ALLERGIES: Allergies  Allergen Reactions  . Penicillins Itching  . Sulfa Antibiotics     weakness  . Aspirin Palpitations    HOME MEDICATIONS: Current Outpatient Medications  Medication Sig Dispense Refill  . estradiol (CLIMARA - DOSED IN MG/24 HR) 0.05 mg/24hr patch APPLY 1 PATCH EVERY WEEK 4 patch 8   No current facility-administered medications for this visit.     PAST MEDICAL HISTORY: Past Medical History:  Diagnosis Date  . Family history of breast cancer    FH Burgess Amor; her genetic testing was negative for this mutation  . Frequent headaches   . History of blood transfusion   . History of UTI   . Hyperparathyroidism [facility])    s/p surgical treatment in 2013    PAST SURGICAL HISTORY: Past Surgical History:  Procedure Laterality Date  . ABDOMINAL HYSTERECTOMY     complete for endometriosis and fibroids  . APPENDECTOMY     w/ hysterectomy  . PARATHYROID EXPLORATION      FAMILY HISTORY: Family History  Problem Relation Age of Onset  . Arthritis Father   . Hyperlipidemia Father   . Hypertension Father   . Heart disease Father   . Breast cancer Mother   . Breast cancer Sister   . Breast cancer Maternal Aunt   . Li-Fraumeni syndrome Sister     SOCIAL HISTORY:   Social History    Socioeconomic History  . Marital status: Married    Spouse name: Not on file  . Number of children: Not on file  . Years of education: Not on file  . Highest education level: Not on file  Occupational History  . Occupation: Retired  Engineer, productionocial Needs  . Financial resource strain: Not on file  . Food insecurity:    Worry: Not on file    Inability: Not on file  . Transportation needs:    Medical: Not on file    Non-medical: Not on file  Tobacco Use  . Smoking status: Former Smoker    Years: 10.00  . Smokeless tobacco: Never Used  Substance and Sexual Activity  . Alcohol use: Yes    Alcohol/week: 0.0 standard drinks    Comment: 1 glass of wine daily  . Drug use: Never  . Sexual activity: Not on file  Lifestyle  . Physical activity:    Days per week: Not on file    Minutes per session: Not on file  . Stress: Not on file  Relationships  . Social connections:    Talks on phone: Not on file    Gets together: Not on file    Attends religious service: Not on file    Active member of club or organization: Not on file    Attends meetings of clubs or organizations: Not on file    Relationship status: Not on file  . Intimate partner violence:    Fear of current or ex partner: Not on file    Emotionally abused: Not on file    Physically abused: Not on file    Forced sexual activity: Not on file  Other Topics Concern  . Not on file  Social History Narrative   Work or School: retired [employer]      Home Situation: lives with husband      Spiritual Beliefs: Christian      Lifestyle: regular exercise; healthy diet      Right handed        Levert FeinsteinYijun Jaceion Aday, M.D. Ph.D.  [facility] [address], [address] [place], [place] [address] Ph: [phone]) [phone] Fax: [phone]  CC: Francena HanlySupple, Kevin, MD

## 2018-09-07 NOTE — Telephone Encounter (Signed)
[place]icare/[place] of [place] order sent to [facility] no auth they will reach out to the patient to schedule.  

## 2018-09-24 ENCOUNTER — Other Ambulatory Visit: Payer: Self-pay

## 2018-09-24 ENCOUNTER — Ambulatory Visit
Admission: RE | Admit: 2018-09-24 | Discharge: 2018-09-24 | Disposition: A | Discharge status: Home / Self Care | Payer: Medicare Other | Source: Ambulatory Visit | Attending: Neurology | Admitting: Neurology

## 2018-09-24 DIAGNOSIS — M542 Cervicalgia: Secondary | ICD-10-CM

## 2018-09-24 DIAGNOSIS — R202 Paresthesia of skin: Secondary | ICD-10-CM

## 2018-09-24 DIAGNOSIS — R29898 Other symptoms and signs involving the musculoskeletal system: Secondary | ICD-10-CM | POA: Diagnosis not present

## 2018-09-25 ENCOUNTER — Telehealth: Payer: Self-pay | Admitting: Neurology

## 2018-09-25 NOTE — Telephone Encounter (Signed)
Please call patient, MRI of cervical spine showed multilevel degenerative changes, most severe at C4-5, C5-6, there is evidence of foraminal narrowing, there is no evidence of spinal cord compression  Please make sure she is on schedule for EMG nerve conduction study

## 2018-09-28 NOTE — Telephone Encounter (Signed)
Spoke to patient to notified of the MRI results below.  She is agreeable to move forward with EMG/NCV.

## 2018-09-29 NOTE — Telephone Encounter (Signed)
Called and spoke with patient and she is scheduled for July 8th

## 2018-10-15 ENCOUNTER — Ambulatory Visit: Payer: Medicare Other | Admitting: Neurology

## 2018-10-31 IMAGING — DX DG CERVICAL SPINE COMPLETE 4+V
5 series · 5 of 5 positions shown · non-contrast
Comparison: None.

CLINICAL DATA: Neck pain.

EXAM:
CERVICAL SPINE - COMPLETE 4+ VIEW

[c-spine lat]
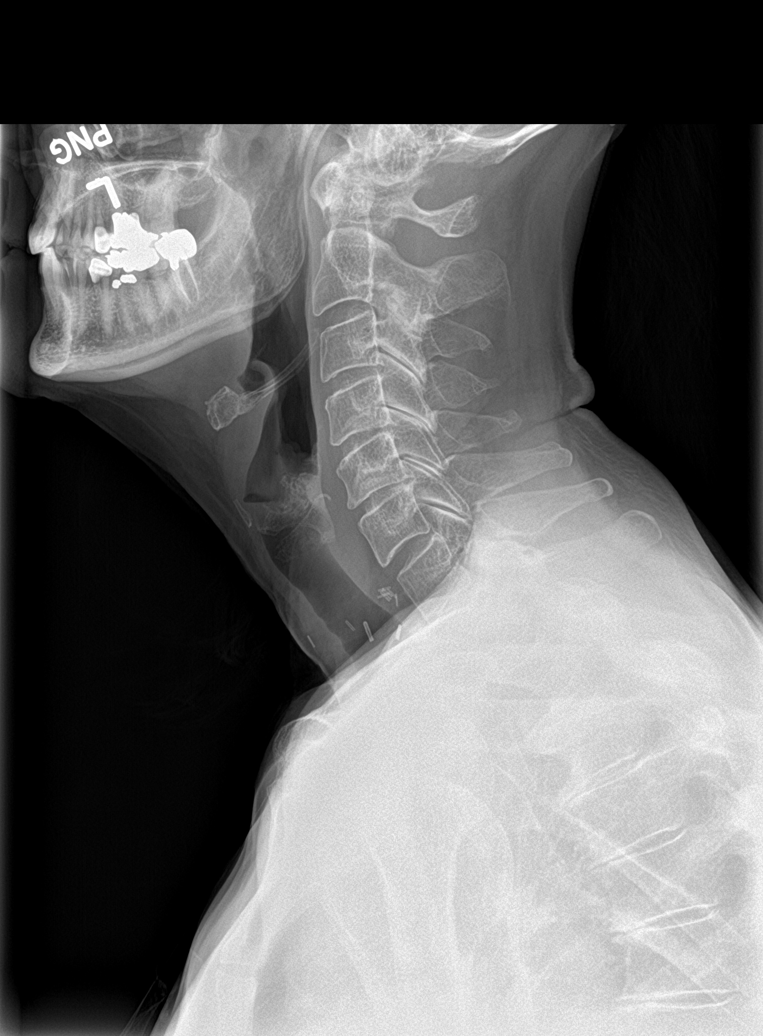

[c-spine obl (1 of 2)]
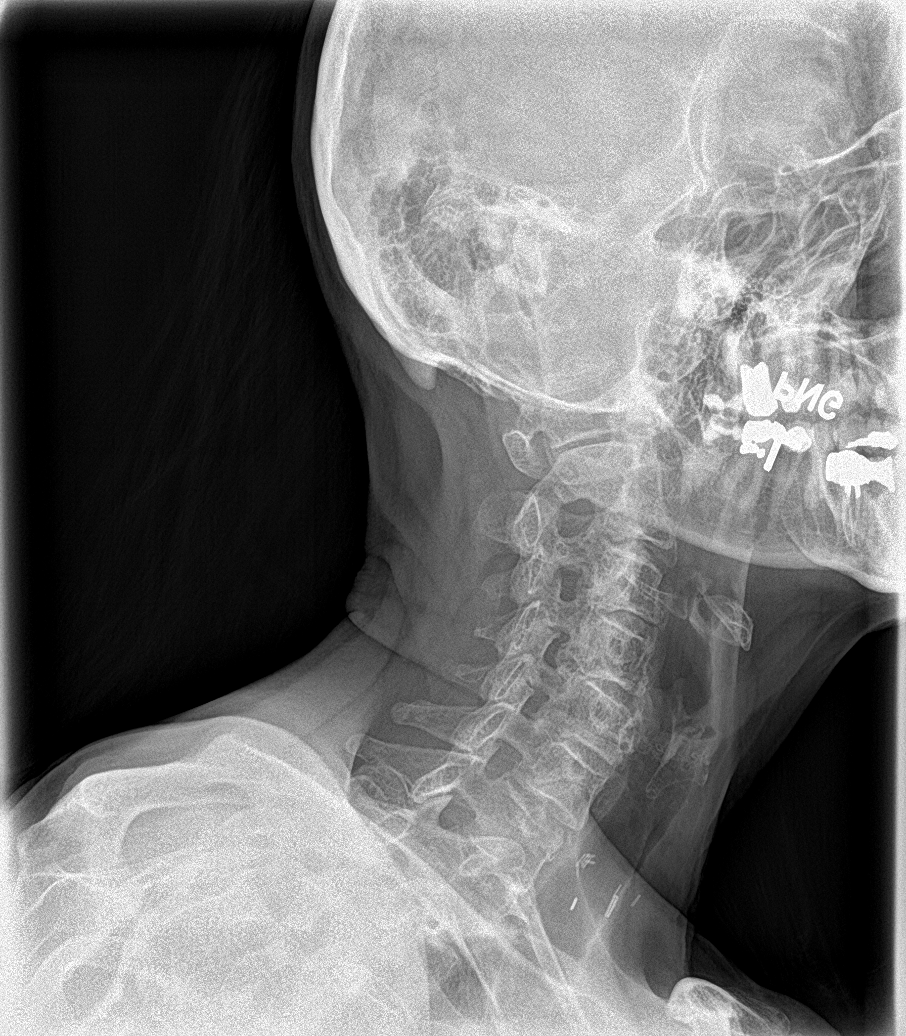

[c-spine obl (2 of 2)]
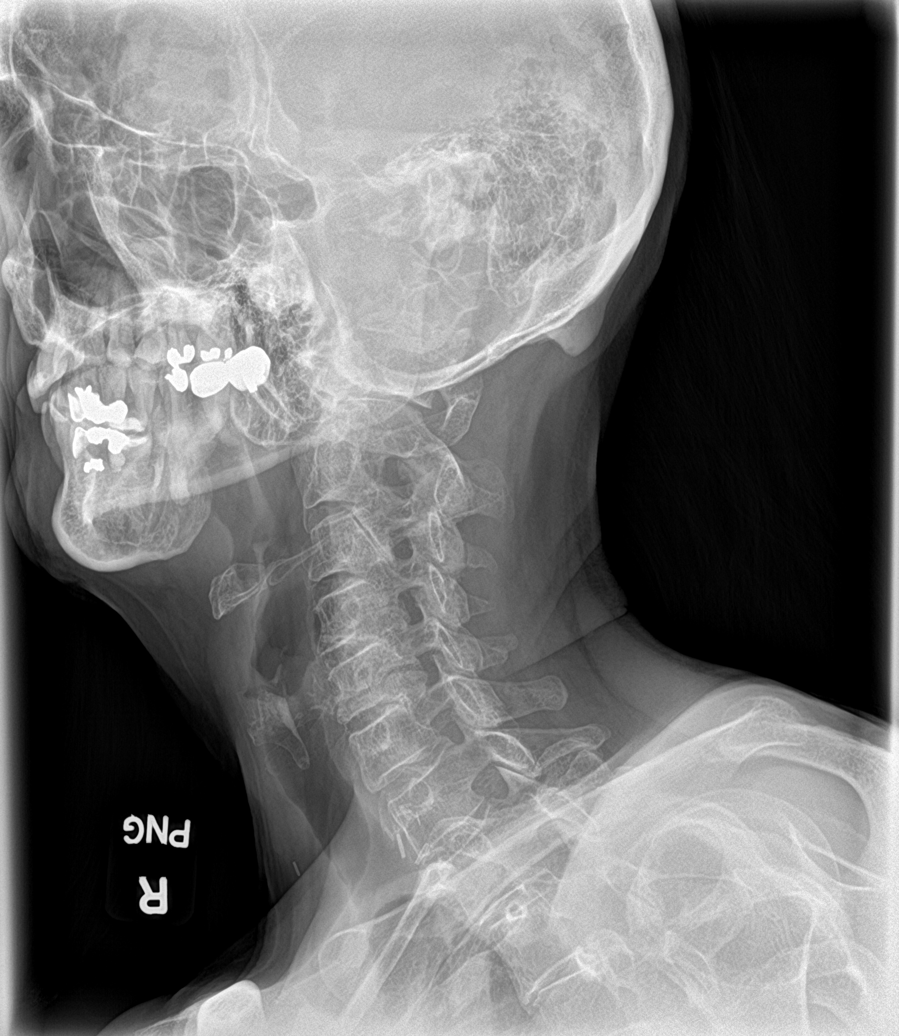

[c-spine ap]
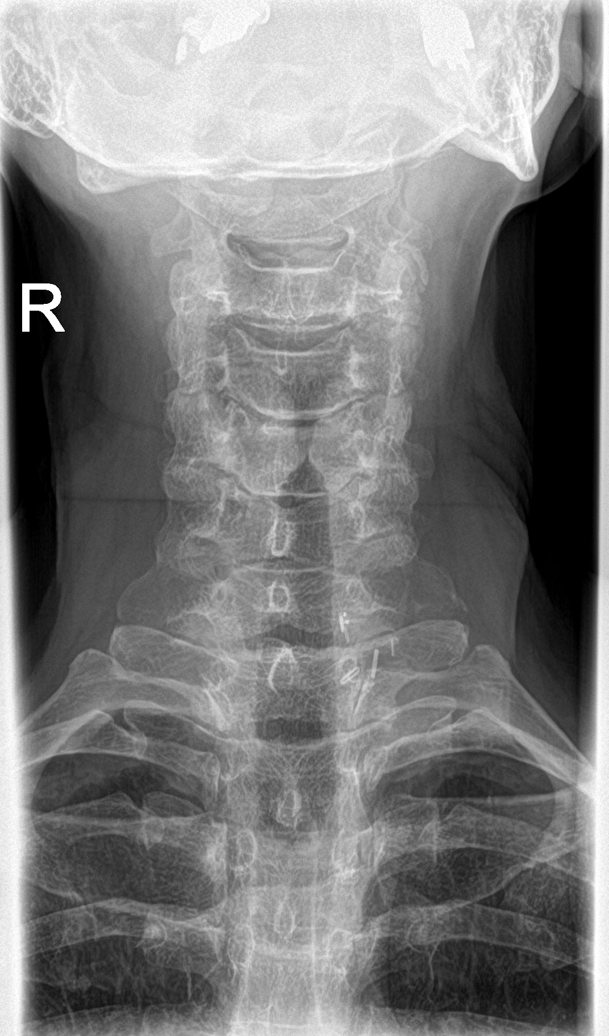

[c-spine open mouth]
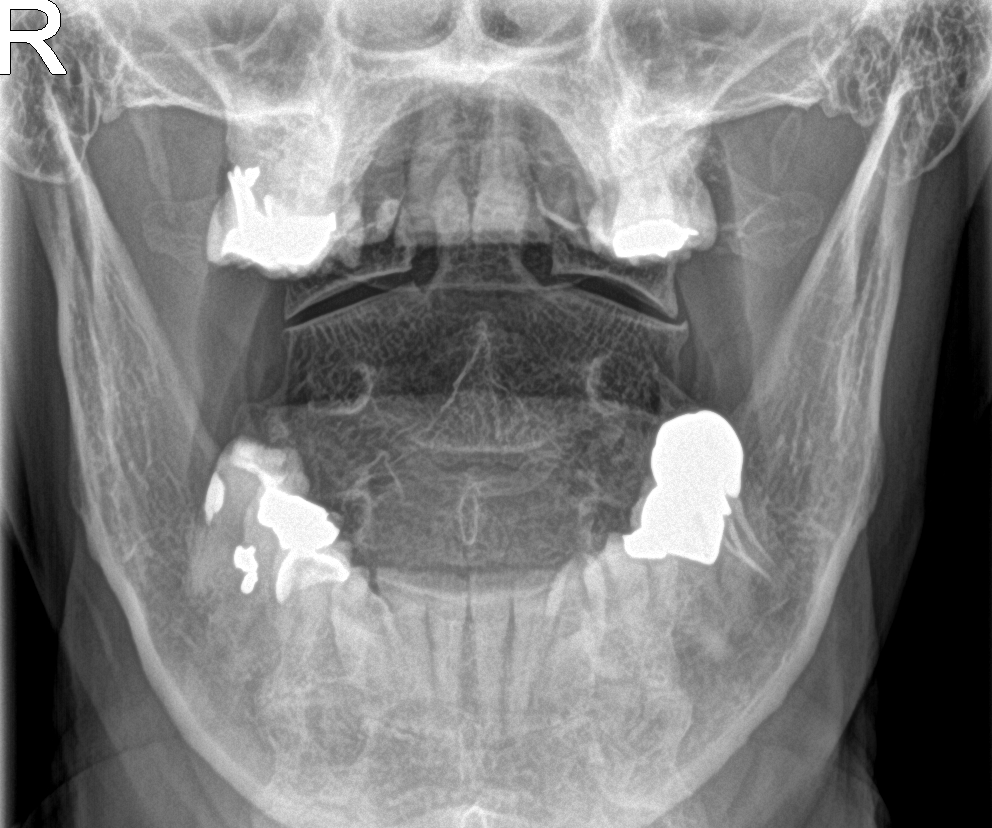

[5 of 5 positions shown; findings below may reference images not displayed]

FINDINGS: The lateral view is diagnostic to the C7 level. There is no acute
fracture or subluxation. Vertebral body heights are preserved.
Alignment is normal. Mild disc height loss at C4-C5 and C5-C6.
Moderate facet arthropathy at C2-C3. Mild right greater than left
neuroforaminal stenosis at C4-C5 and C5-C6 due to facet
uncovertebral hypertrophy.Normal prevertebral soft tissues. Surgical
clips in the left lower neck.
IMPRESSION: 1. Mild degenerative changes throughout the cervical spine. No acute
osseous abnormality.

## 2018-11-04 ENCOUNTER — Encounter: Payer: Self-pay | Admitting: Neurology

## 2018-11-04 DIAGNOSIS — H9311 Tinnitus, right ear: Secondary | ICD-10-CM | POA: Diagnosis not present

## 2018-11-04 DIAGNOSIS — R42 Dizziness and giddiness: Secondary | ICD-10-CM | POA: Diagnosis not present

## 2018-11-04 DIAGNOSIS — H9313 Tinnitus, bilateral: Secondary | ICD-10-CM | POA: Diagnosis not present

## 2018-11-04 DIAGNOSIS — H903 Sensorineural hearing loss, bilateral: Secondary | ICD-10-CM | POA: Diagnosis not present

## 2018-11-04 DIAGNOSIS — H938X1 Other specified disorders of right ear: Secondary | ICD-10-CM | POA: Diagnosis not present

## 2018-11-11 DIAGNOSIS — Z79899 Other long term (current) drug therapy: Secondary | ICD-10-CM | POA: Diagnosis not present

## 2018-11-19 DIAGNOSIS — H903 Sensorineural hearing loss, bilateral: Secondary | ICD-10-CM | POA: Diagnosis not present

## 2018-11-19 DIAGNOSIS — H9311 Tinnitus, right ear: Secondary | ICD-10-CM | POA: Diagnosis not present

## 2018-11-19 DIAGNOSIS — J0100 Acute maxillary sinusitis, unspecified: Secondary | ICD-10-CM | POA: Diagnosis not present

## 2018-11-19 DIAGNOSIS — H938X1 Other specified disorders of right ear: Secondary | ICD-10-CM | POA: Diagnosis not present

## 2018-11-30 DIAGNOSIS — R42 Dizziness and giddiness: Secondary | ICD-10-CM | POA: Diagnosis not present

## 2018-11-30 DIAGNOSIS — H9311 Tinnitus, right ear: Secondary | ICD-10-CM | POA: Diagnosis not present

## 2018-11-30 DIAGNOSIS — H938X1 Other specified disorders of right ear: Secondary | ICD-10-CM | POA: Diagnosis not present

## 2018-12-16 ENCOUNTER — Encounter: Payer: Self-pay | Admitting: Neurology

## 2018-12-23 DIAGNOSIS — H9313 Tinnitus, bilateral: Secondary | ICD-10-CM | POA: Diagnosis not present

## 2018-12-23 DIAGNOSIS — H903 Sensorineural hearing loss, bilateral: Secondary | ICD-10-CM | POA: Diagnosis not present

## 2018-12-23 DIAGNOSIS — H8101 Meniere's disease, right ear: Secondary | ICD-10-CM | POA: Diagnosis not present

## 2018-12-23 DIAGNOSIS — H938X1 Other specified disorders of right ear: Secondary | ICD-10-CM | POA: Diagnosis not present

## 2018-12-23 DIAGNOSIS — H9311 Tinnitus, right ear: Secondary | ICD-10-CM | POA: Diagnosis not present

## 2019-01-07 ENCOUNTER — Other Ambulatory Visit: Payer: Self-pay | Admitting: Family Medicine

## 2019-01-21 DIAGNOSIS — H9313 Tinnitus, bilateral: Secondary | ICD-10-CM | POA: Diagnosis not present

## 2019-01-21 DIAGNOSIS — H8101 Meniere's disease, right ear: Secondary | ICD-10-CM | POA: Diagnosis not present

## 2019-01-21 DIAGNOSIS — H903 Sensorineural hearing loss, bilateral: Secondary | ICD-10-CM | POA: Diagnosis not present

## 2019-02-01 ENCOUNTER — Other Ambulatory Visit: Payer: Self-pay | Admitting: Family Medicine

## 2019-03-01 ENCOUNTER — Other Ambulatory Visit: Payer: Self-pay | Admitting: Family Medicine

## 2019-04-20 ENCOUNTER — Other Ambulatory Visit: Payer: Self-pay | Admitting: Family Medicine

## 2019-05-05 DIAGNOSIS — R21 Rash and other nonspecific skin eruption: Secondary | ICD-10-CM | POA: Diagnosis not present

## 2019-05-05 DIAGNOSIS — L299 Pruritus, unspecified: Secondary | ICD-10-CM | POA: Diagnosis not present

## 2019-05-05 DIAGNOSIS — L304 Erythema intertrigo: Secondary | ICD-10-CM | POA: Diagnosis not present

## 2019-05-15 DIAGNOSIS — M791 Myalgia, unspecified site: Secondary | ICD-10-CM | POA: Diagnosis not present

## 2019-06-01 DIAGNOSIS — L989 Disorder of the skin and subcutaneous tissue, unspecified: Secondary | ICD-10-CM | POA: Diagnosis not present

## 2019-06-01 DIAGNOSIS — Z01419 Encounter for gynecological examination (general) (routine) without abnormal findings: Secondary | ICD-10-CM | POA: Diagnosis not present

## 2019-06-01 DIAGNOSIS — E8941 Symptomatic postprocedural ovarian failure: Secondary | ICD-10-CM | POA: Diagnosis not present

## 2019-06-01 DIAGNOSIS — Z1231 Encounter for screening mammogram for malignant neoplasm of breast: Secondary | ICD-10-CM | POA: Diagnosis not present

## 2019-06-01 DIAGNOSIS — M8580 Other specified disorders of bone density and structure, unspecified site: Secondary | ICD-10-CM | POA: Diagnosis not present

## 2019-06-04 ENCOUNTER — Other Ambulatory Visit: Payer: Self-pay | Admitting: Obstetrics and Gynecology

## 2019-06-04 DIAGNOSIS — M859 Disorder of bone density and structure, unspecified: Secondary | ICD-10-CM | POA: Diagnosis not present

## 2019-06-04 DIAGNOSIS — Z1331 Encounter for screening for depression: Secondary | ICD-10-CM | POA: Diagnosis not present

## 2019-06-04 DIAGNOSIS — N6489 Other specified disorders of breast: Secondary | ICD-10-CM

## 2019-06-04 DIAGNOSIS — E7849 Other hyperlipidemia: Secondary | ICD-10-CM | POA: Diagnosis not present

## 2019-06-04 DIAGNOSIS — Z8639 Personal history of other endocrine, nutritional and metabolic disease: Secondary | ICD-10-CM | POA: Diagnosis not present

## 2019-06-04 DIAGNOSIS — Z780 Asymptomatic menopausal state: Secondary | ICD-10-CM | POA: Diagnosis not present

## 2019-06-18 ENCOUNTER — Other Ambulatory Visit: Payer: Self-pay | Admitting: Internal Medicine

## 2019-06-18 DIAGNOSIS — M8580 Other specified disorders of bone density and structure, unspecified site: Secondary | ICD-10-CM

## 2019-06-21 ENCOUNTER — Other Ambulatory Visit: Payer: Self-pay

## 2019-06-21 ENCOUNTER — Ambulatory Visit: Payer: Medicare Other

## 2019-06-21 ENCOUNTER — Ambulatory Visit
Admission: RE | Admit: 2019-06-21 | Discharge: 2019-06-21 | Disposition: A | Discharge status: Home / Self Care | Payer: Medicare Other | Source: Ambulatory Visit | Attending: Obstetrics and Gynecology | Admitting: Obstetrics and Gynecology

## 2019-06-21 DIAGNOSIS — N6489 Other specified disorders of breast: Secondary | ICD-10-CM

## 2019-06-21 DIAGNOSIS — R922 Inconclusive mammogram: Secondary | ICD-10-CM | POA: Diagnosis not present

## 2019-07-27 DIAGNOSIS — E7849 Other hyperlipidemia: Secondary | ICD-10-CM | POA: Diagnosis not present

## 2019-07-27 DIAGNOSIS — E559 Vitamin D deficiency, unspecified: Secondary | ICD-10-CM | POA: Diagnosis not present

## 2019-08-03 DIAGNOSIS — E7849 Other hyperlipidemia: Secondary | ICD-10-CM | POA: Diagnosis not present

## 2019-08-03 DIAGNOSIS — M859 Disorder of bone density and structure, unspecified: Secondary | ICD-10-CM | POA: Diagnosis not present

## 2019-08-03 DIAGNOSIS — E559 Vitamin D deficiency, unspecified: Secondary | ICD-10-CM | POA: Diagnosis not present

## 2019-08-03 DIAGNOSIS — Z1212 Encounter for screening for malignant neoplasm of rectum: Secondary | ICD-10-CM | POA: Diagnosis not present

## 2019-08-03 DIAGNOSIS — Z0000 Encounter for general adult medical examination without abnormal findings: Secondary | ICD-10-CM | POA: Diagnosis not present

## 2019-08-03 DIAGNOSIS — E892 Postprocedural hypoparathyroidism: Secondary | ICD-10-CM | POA: Diagnosis not present

## 2019-08-03 DIAGNOSIS — I739 Peripheral vascular disease, unspecified: Secondary | ICD-10-CM | POA: Diagnosis not present

## 2019-08-03 DIAGNOSIS — R21 Rash and other nonspecific skin eruption: Secondary | ICD-10-CM | POA: Diagnosis not present

## 2019-08-06 ENCOUNTER — Other Ambulatory Visit (HOSPITAL_COMMUNITY): Payer: Self-pay | Admitting: Internal Medicine

## 2019-08-06 ENCOUNTER — Ambulatory Visit (HOSPITAL_COMMUNITY)
Admission: RE | Admit: 2019-08-06 | Discharge: 2019-08-06 | Disposition: A | Discharge status: Home / Self Care | Payer: Medicare Other | Source: Ambulatory Visit | Attending: Vascular Surgery | Admitting: Vascular Surgery

## 2019-08-06 ENCOUNTER — Other Ambulatory Visit: Payer: Self-pay

## 2019-08-06 DIAGNOSIS — I739 Peripheral vascular disease, unspecified: Secondary | ICD-10-CM | POA: Diagnosis not present

## 2020-04-03 DIAGNOSIS — J329 Chronic sinusitis, unspecified: Secondary | ICD-10-CM | POA: Diagnosis not present

## 2020-07-11 ENCOUNTER — Other Ambulatory Visit: Payer: Self-pay | Admitting: Internal Medicine

## 2020-07-11 DIAGNOSIS — M8580 Other specified disorders of bone density and structure, unspecified site: Secondary | ICD-10-CM

## 2020-07-11 DIAGNOSIS — Z1231 Encounter for screening mammogram for malignant neoplasm of breast: Secondary | ICD-10-CM

## 2020-08-29 ENCOUNTER — Other Ambulatory Visit: Payer: Self-pay

## 2020-08-29 ENCOUNTER — Ambulatory Visit
Admission: RE | Admit: 2020-08-29 | Discharge: 2020-08-29 | Disposition: A | Discharge status: Home / Self Care | Payer: Medicare Other | Source: Ambulatory Visit | Attending: Internal Medicine | Admitting: Internal Medicine

## 2020-08-29 DIAGNOSIS — Z1231 Encounter for screening mammogram for malignant neoplasm of breast: Secondary | ICD-10-CM

## 2020-12-19 ENCOUNTER — Other Ambulatory Visit: Payer: Self-pay

## 2020-12-19 ENCOUNTER — Ambulatory Visit
Admission: RE | Admit: 2020-12-19 | Discharge: 2020-12-19 | Disposition: A | Discharge status: Home / Self Care | Payer: Medicare PPO | Source: Ambulatory Visit | Attending: Internal Medicine | Admitting: Internal Medicine

## 2020-12-19 DIAGNOSIS — M8580 Other specified disorders of bone density and structure, unspecified site: Secondary | ICD-10-CM

## 2021-08-27 ENCOUNTER — Other Ambulatory Visit: Payer: Self-pay | Admitting: Internal Medicine

## 2021-08-27 DIAGNOSIS — Z1231 Encounter for screening mammogram for malignant neoplasm of breast: Secondary | ICD-10-CM

## 2021-09-04 ENCOUNTER — Ambulatory Visit: Payer: Medicare PPO

## 2021-09-11 ENCOUNTER — Ambulatory Visit: Payer: Medicare PPO

## 2021-09-12 ENCOUNTER — Ambulatory Visit
Admission: RE | Admit: 2021-09-12 | Discharge: 2021-09-12 | Disposition: A | Discharge status: Home / Self Care | Payer: Medicare PPO | Source: Ambulatory Visit | Attending: Internal Medicine | Admitting: Internal Medicine

## 2021-09-12 DIAGNOSIS — Z1231 Encounter for screening mammogram for malignant neoplasm of breast: Secondary | ICD-10-CM

## 2022-12-18 ENCOUNTER — Other Ambulatory Visit: Payer: Self-pay | Admitting: Internal Medicine

## 2022-12-18 DIAGNOSIS — E785 Hyperlipidemia, unspecified: Secondary | ICD-10-CM

## 2022-12-25 ENCOUNTER — Ambulatory Visit
Admission: RE | Admit: 2022-12-25 | Discharge: 2022-12-25 | Disposition: A | Discharge status: Home / Self Care | Payer: Medicare PPO | Source: Ambulatory Visit | Attending: Internal Medicine | Admitting: Internal Medicine

## 2022-12-25 DIAGNOSIS — E785 Hyperlipidemia, unspecified: Secondary | ICD-10-CM

## 2023-01-16 DIAGNOSIS — R0982 Postnasal drip: Secondary | ICD-10-CM | POA: Diagnosis not present

## 2023-01-16 DIAGNOSIS — R0981 Nasal congestion: Secondary | ICD-10-CM | POA: Diagnosis not present

## 2023-02-19 DIAGNOSIS — R0982 Postnasal drip: Secondary | ICD-10-CM | POA: Diagnosis not present

## 2023-02-19 DIAGNOSIS — H9313 Tinnitus, bilateral: Secondary | ICD-10-CM | POA: Diagnosis not present

## 2023-02-19 DIAGNOSIS — H9201 Otalgia, right ear: Secondary | ICD-10-CM | POA: Diagnosis not present

## 2023-02-19 DIAGNOSIS — R0981 Nasal congestion: Secondary | ICD-10-CM | POA: Diagnosis not present

## 2023-02-25 DIAGNOSIS — E8941 Symptomatic postprocedural ovarian failure: Secondary | ICD-10-CM | POA: Diagnosis not present

## 2023-02-25 DIAGNOSIS — E063 Autoimmune thyroiditis: Secondary | ICD-10-CM | POA: Diagnosis not present

## 2023-02-25 DIAGNOSIS — F410 Panic disorder [episodic paroxysmal anxiety] without agoraphobia: Secondary | ICD-10-CM | POA: Diagnosis not present

## 2023-04-09 ENCOUNTER — Ambulatory Visit: Payer: Medicare PPO | Admitting: Allergy & Immunology

## 2023-04-09 ENCOUNTER — Other Ambulatory Visit: Payer: Self-pay

## 2023-04-09 ENCOUNTER — Encounter: Payer: Self-pay | Admitting: Allergy & Immunology

## 2023-04-09 VITALS — BP 128/78 | HR 116 | Temp 97.0°F | Ht 65.5 in | Wt 125.2 lb

## 2023-04-09 DIAGNOSIS — J310 Chronic rhinitis: Secondary | ICD-10-CM | POA: Diagnosis not present

## 2023-04-09 NOTE — Progress Notes (Signed)
NEW PATIENT  Date of Service/Encounter:  04/09/23  Consult requested by: Melida Quitter, MD   Assessment:   Chronic rhinitis - planning for skin testing at the next visit  Retired Conservator, museum/gallery   Plan/Recommendations:   1. Chronic rhinitis - Because of insurance stipulations, we cannot do skin testing on the same day as your first visit. - We are all working to fight this, but for now we need to do two separate visits.  - We will know more after we do testing at the next visit.  - The skin testing visit can be squeezed in at your convenience.  - Then we can make a more full plan to address all of your symptoms. - Be sure to stop your antihistamines for 3 days before this appointment.   2. Return in about 1 week (around 04/16/2023) for SKIN TESTING (1-55). You can have the follow up appointment with Dr. Dellis Anes or a Nurse Practicioner (our Nurse Practitioners are excellent and always have Physician oversight!).    This note in its entirety was forwarded to the Provider who requested this consultation.  Subjective:   Andrea Gardner is a 72 y.o. female presenting today for evaluation of  Chief Complaint  Patient presents with   Establish Care   Allergy Testing   Nasal Congestion    Sinus drainage, lost of smell, ears ringing, hoarseness    Andrea Gardner has a history of the following: Patient Active Problem List   Diagnosis Date Noted   Neck pain 09/07/2018   Right arm weakness 09/07/2018   Paresthesia 09/07/2018   Hot flashes 02/06/2017   Hx of hyperparathyroidism 02/06/2017   Hyperlipemia 08/10/2015    History obtained from: chart review and patient.  Discussed the use of AI scribe software for clinical note transcription with the patient and/or guardian, who gave verbal consent to proceed.  [name] was referred by Melida Quitter, MD.     Andrea Gardner is a 72 y.o. female presenting for an evaluation of chronic rhinitis  .  Andrea Gardner has been experiencing persistent symptoms since childhood. Despite trials of over-the-counter Zyrtec and two nasal sprays, Flonase and Ipratropium, there has been no improvement in symptoms. The patient denies any history of asthma and has not been on antibiotics or prednisone for these symptoms. She was allergy tested years ago, but the details of this are unclear. The patient's symptoms seem to worsen with weather changes.  The patient has also been experiencing tinnitus since the start of the year, which led to a consultation with an ENT specialist. She has not had a sinus CT recently. The patient has been using a nose spray, which has resulted in a dry nose, but no improvement in symptoms.   She saw ENT earlier this year. Rhinoscopy was completely normal. She had no purulence of polyps noted.   The patient is generally healthy, with no history of pneumonias or skin infections. She has had strep throat once in her life and has been exposed to common childhood illnesses. The patient lives in a 74 year old house with wood flooring in the main living areas and carpeting in the bedroom. She has a cat and no tobacco exposure. There are no dust mite covers on the bedding. The patient works as a Pharmacist, hospital.   Skin Symptom History: The patient also reports a history of a rash a few years ago, for which she was treated with prednisone. She denies any food allergies and maintains  a healthy diet. The patient has been exposed to fumes and chemicals through her hobbies, but the specifics of this exposure are not detailed. This seems to be an isolated event.   Otherwise, there is no history of other atopic diseases, including asthma, food allergies, drug allergies, stinging insect allergies, or contact dermatitis. There is no significant infectious history. Vaccinations are up to date.    Past Medical History: Patient Active Problem List   Diagnosis Date Noted   Neck pain 09/07/2018    Right arm weakness 09/07/2018   Paresthesia 09/07/2018   Hot flashes 02/06/2017   Hx of hyperparathyroidism 02/06/2017   Hyperlipemia 08/10/2015    Medication List:  Allergies as of 04/09/2023       Reactions   Penicillins Itching   Sulfa Antibiotics    weakness   Aspirin Palpitations        Medication List        Accurate as of April 09, 2023  5:03 PM. If you have any questions, ask your nurse or doctor.          ALPRAZolam 0.5 MG tablet Commonly known as: XANAX Take 0.5 mg by mouth as needed.   ascorbic acid 500 MG tablet Commonly known as: VITAMIN C Take by mouth.   diazepam 5 MG tablet Commonly known as: Valium Take 1 tablet (5 mg total) by mouth as needed. Take 1-2 tablets 30 minutes prior to MRI, may repeat once as needed. Must have driver.   estradiol 0.05 mg/24hr patch Commonly known as: CLIMARA - Dosed in mg/24 hr APPLY 1 PATCH TOPICALLY EVERY WEEK   fluticasone 50 MCG/ACT nasal spray Commonly known as: FLONASE 2 sprays in each nostril once a day.   ipratropium 0.06 % nasal spray Commonly known as: ATROVENT 1 to 2 sprays in each nostril 3 times daily   VITAMIN B COMPLEX W/B-12 PO Take by mouth.        Birth History: non-contributory  Developmental History: non-contributory  Past Surgical History: Past Surgical History:  Procedure Laterality Date   ABDOMINAL HYSTERECTOMY     complete for endometriosis and fibroids   APPENDECTOMY     w/ hysterectomy   PARATHYROID EXPLORATION       Family History: Family History  Problem Relation Age of Onset   Breast cancer Mother    Allergic rhinitis Father    Arthritis Father    Hyperlipidemia Father    Hypertension Father    Heart disease Father    Breast cancer Sister    Li-Fraumeni syndrome Sister    Breast cancer Maternal Aunt    Asthma Daughter      Social History: Andrea Gardner lives at home with his family.  She lives in a house that is 74 years old.  There is hardwood throughout  the home.  She has been eating and central cooling.  She also.  There are dust mite covers on the bedding.  There is no tobacco exposure.  She is currently tired.  She does have a HEPA filter.  She does not live interstate..  There is exposure to fumes, chemicals or dust. She likes to sew and cook.    Review of systems otherwise negative other than that mentioned in the HPI.    Objective:   Blood pressure 128/78, pulse (!) 116, temperature (!) 97 F (36.1 C), height 5' 5.5" (1.664 m), weight 125 lb 3.2 oz (56.8 kg), SpO2 99%. Body mass index is 20.52 kg/m.     Physical Exam Vitals  reviewed.  Constitutional:      Appearance: She is well-developed.     Comments: Hoarse voice.   HENT:     Head: Normocephalic and atraumatic.     Right Ear: Tympanic membrane, ear canal and external ear normal. No drainage, swelling or tenderness. Tympanic membrane is not injected, scarred, erythematous, retracted or bulging.     Left Ear: Tympanic membrane, ear canal and external ear normal. No drainage, swelling or tenderness. Tympanic membrane is not injected, scarred, erythematous, retracted or bulging.     Nose: No nasal deformity, septal deviation, mucosal edema or rhinorrhea.     Right Turbinates: Enlarged, swollen and pale.     Left Turbinates: Enlarged, swollen and pale.     Right Sinus: No maxillary sinus tenderness or frontal sinus tenderness.     Left Sinus: No maxillary sinus tenderness or frontal sinus tenderness.     Mouth/Throat:     Mouth: Mucous membranes are not pale and not dry.     Pharynx: Uvula midline.  Eyes:     General: Allergic shiner present.        Right eye: No discharge.        Left eye: No discharge.     Conjunctiva/sclera: Conjunctivae normal.     Right eye: Right conjunctiva is not injected. No chemosis.    Left eye: Left conjunctiva is not injected. No chemosis.    Pupils: Pupils are equal, round, and reactive to light.  Cardiovascular:     Rate and Rhythm:  Normal rate and regular rhythm.     Heart sounds: Normal heart sounds.  Pulmonary:     Effort: Pulmonary effort is normal. No tachypnea, accessory muscle usage or respiratory distress.     Breath sounds: Normal breath sounds. No wheezing, rhonchi or rales.     Comments: Moving air well in all lung fields. No increased work of breathing.  Chest:     Chest wall: No tenderness.  Abdominal:     Tenderness: There is no abdominal tenderness. There is no guarding or rebound.  Lymphadenopathy:     Head:     Right side of head: No submandibular, tonsillar or occipital adenopathy.     Left side of head: No submandibular, tonsillar or occipital adenopathy.     Cervical: No cervical adenopathy.  Skin:    Coloration: Skin is not pale.     Findings: No abrasion, erythema, petechiae or rash. Rash is not papular, urticarial or vesicular.  Neurological:     Mental Status: She is alert.  Psychiatric:        Behavior: Behavior is cooperative.      Diagnostic studies: deferred due to insurance stipulations that require a separate visit for testing         Malachi Bonds, MD [facility]

## 2023-04-09 NOTE — Patient Instructions (Addendum)
1. Chronic rhinitis - Because of insurance stipulations, we cannot do skin testing on the same day as your first visit. - We are all working to fight this, but for now we need to do two separate visits.  - We will know more after we do testing at the next visit.  - The skin testing visit can be squeezed in at your convenience.  - Then we can make a more full plan to address all of your symptoms. - Be sure to stop your antihistamines for 3 days before this appointment.   2. Return in about 1 week (around 04/16/2023) for SKIN TESTING (1-55). You can have the follow up appointment with Dr. Dellis Anes or a Nurse Practicioner (our Nurse Practitioners are excellent and always have Physician oversight!).    Please inform us of any Emergency Department visits, hospitalizations, or changes in symptoms. Call us before going to the ED for breathing or allergy symptoms since we might be able to fit you in for a sick visit. Feel free to contact us anytime with any questions, problems, or concerns.  It was a pleasure to meet you today!  Websites that have reliable patient information: 1. American Academy of Asthma, Allergy, and Immunology: www.aaaai.org 2. Food Allergy Research and Education (FARE): foodallergy.org 3. Mothers of Asthmatics: http://www.asthmacommunitynetwork.org 4. American College of Allergy, Asthma, and Immunology: www.acaai.org      "Like" [place] on Facebook and Instagram for our latest updates!      A healthy democracy works best when [employer] participate! Make sure you are registered to vote! If you have moved or changed any of your contact information, you will need to get this updated before voting! Scan the QR codes below to learn more!

## 2023-04-14 DIAGNOSIS — H524 Presbyopia: Secondary | ICD-10-CM | POA: Diagnosis not present

## 2023-04-14 DIAGNOSIS — H2513 Age-related nuclear cataract, bilateral: Secondary | ICD-10-CM | POA: Diagnosis not present

## 2023-05-02 ENCOUNTER — Ambulatory Visit: Payer: Medicare PPO | Admitting: Allergy & Immunology

## 2023-05-16 ENCOUNTER — Ambulatory Visit: Payer: Medicare PPO | Admitting: Allergy & Immunology

## 2023-05-16 DIAGNOSIS — J310 Chronic rhinitis: Secondary | ICD-10-CM

## 2023-05-16 MED ORDER — CARBINOXAMINE MALEATE 4 MG PO TABS: 4.0000 mg | ORAL_TABLET | Freq: Three times a day (TID) | ORAL | 5 refills | Status: AC | PRN

## 2023-05-16 MED ORDER — MONTELUKAST SODIUM 10 MG PO TABS: 10.0000 mg | ORAL_TABLET | Freq: Every day | ORAL | 5 refills | Status: AC

## 2023-05-16 NOTE — Progress Notes (Unsigned)
   FOLLOW UP  Date of Service/Encounter:  05/16/23   Assessment:   Chronic rhinitis - planning for skin testing at the next visit   Retired Conservator, museum/gallery     Plan/Recommendations:   There are no Patient Instructions on file for this visit.   Subjective:   Andrea Gardner is a 73 y.o. female presenting today for follow up of No chief complaint on file.   Andrea Gardner has a history of the following: Patient Active Problem List   Diagnosis Date Noted   Neck pain 09/07/2018   Right arm weakness 09/07/2018   Paresthesia 09/07/2018   Hot flashes 02/06/2017   Hx of hyperparathyroidism 02/06/2017   Hyperlipemia 08/10/2015    History obtained from: chart review and {Persons; PED relatives w/patient:19415::"patient"}.  Discussed the use of AI scribe software for clinical note transcription with the patient and/or guardian, who gave verbal consent to proceed.  Ezell is a 73 y.o. female presenting for skin testing. She was last seen on her December 11th. We could not do testing because her insurance company does not cover testing on the same day as a New Patient visit. She has been off of all antihistamines 3 days in anticipation of the testing.   Otherwise, there have been no changes to her past medical history, surgical history, family history, or social history.    Review of systems otherwise negative other than that mentioned in the HPI.    Objective:   There were no vitals taken for this visit. There is no height or weight on file to calculate BMI.    Physical exam deferred since this was a skin testing appointment only.   Diagnostic studies: {Blank single:19197::"none","deferred due to recent antihistamine use","labs sent instead"," "}  Spirometry: {Blank single:19197::"results normal (FEV1: ***%, FVC: ***%, FEV1/FVC: ***%)","results abnormal (FEV1: ***%, FVC: ***%, FEV1/FVC: ***%)"}.    {Blank single:19197::"Spirometry consistent with mild  obstructive disease","Spirometry consistent with moderate obstructive disease","Spirometry consistent with severe obstructive disease","Spirometry consistent with possible restrictive disease","Spirometry consistent with mixed obstructive and restrictive disease","Spirometry uninterpretable due to technique","Spirometry consistent with normal pattern"}. {Blank single:19197::"Albuterol/Atrovent nebulizer","Xopenex/Atrovent nebulizer","Albuterol nebulizer","Albuterol four puffs via MDI","Xopenex four puffs via MDI"} treatment given in clinic with {Blank single:19197::"significant improvement in FEV1 per ATS criteria","significant improvement in FVC per ATS criteria","significant improvement in FEV1 and FVC per ATS criteria","improvement in FEV1, but not significant per ATS criteria","improvement in FVC, but not significant per ATS criteria","improvement in FEV1 and FVC, but not significant per ATS criteria","no improvement"}.  Allergy Studies: {Blank single:19197::"none","labs sent instead"," "}    {Blank single:19197::"Allergy testing results were read and interpreted by myself, documented by clinical staff."," "}      Malachi Bonds, MD  [facility]

## 2023-05-16 NOTE — Patient Instructions (Addendum)
1. Chronic rhinitis - Testing today showed: NEGATIVE TO THE ENTIRE PANEL - Copy of test results provided.  - Avoidance measures provided. - Stop taking: all of your current medications  - Start taking: carbinoxamine 4mg  tablet 3-4 times daily as needed (this can cause sleepiness, so beware of this) and Singulair (montelukast) 10mg  daily. - Singulair cna cause irritability and bad dreams, so beware of this side effect and stop it if happens.  2. Return in about 3 months (around 08/14/2023). You can have the follow up appointment with Dr. Dellis Anes or a Nurse Practicioner (our Nurse Practitioners are excellent and always have Physician oversight!).    Please inform us of any Emergency Department visits, hospitalizations, or changes in symptoms. Call us before going to the ED for breathing or allergy symptoms since we might be able to fit you in for a sick visit. Feel free to contact us anytime with any questions, problems, or concerns.  It was a pleasure to see you again today!  Websites that have reliable patient information: 1. American Academy of Asthma, Allergy, and Immunology: www.aaaai.org 2. Food Allergy Research and Education (FARE): foodallergy.org 3. Mothers of Asthmatics: http://www.asthmacommunitynetwork.org 4. American College of Allergy, Asthma, and Immunology: www.acaai.org      "Like" [place] on Facebook and Instagram for our latest updates!      A healthy democracy works best when [employer] participate! Make sure you are registered to vote! If you have moved or changed any of your contact information, you will need to get this updated before voting! Scan the QR codes below to learn more!        Airborne Adult Perc - 05/16/23 1015     Time Antigen Placed 1015    Allergen Manufacturer Waynette Buttery    Location Back    Number of Test 55    1. Control-Buffer 50% Glycerol Negative    2. Control-Histamine 2+    3. Bahia Negative    4. French [place] Territ[place] Negative    5. Johnson Negative     6. Kentucky Blue Negative    7. Meadow Fescue Negative    8. Perennial Rye Negative    9. Timothy Negative    10. Ragweed Mix Negative    11. Cocklebur Negative    12. Plantain,  English Negative    13. Baccharis Negative    14. Dog Fennel Negative    15. Russian Thistle Negative    16. Lamb's Quarters Negative    17. Sheep Sorrell Negative    18. Rough Pigweed Negative    19. Marsh Elder, Rough Negative    20. Mugwort, Common Negative    21. Box, Elder Negative    22. Cedar, red Negative    23. Sweet Gum Negative    24. Pecan Pollen Negative    25. Pine Mix Negative    26. Walnut, Black Pollen Negative    27. Red Mulberry Negative    28. Ash Mix Negative    29. Birch Mix Negative    30. Beech American Negative    31. Cottonwood, [place] Negative    32. Hickory, White Negative    33. Maple Mix Negative    34. Oak, [place] Mix Negative    35. Sycamore Eastern Negative    36. Alternaria Alternata Negative    37. Cladosporium Herbarum Negative    38. Aspergillus Mix Negative    39. Penicillium Mix Negative    40. Bipolaris Sorokiniana (Helminthosporium) Negative    41. Drechslera Spicifera (Curvularia) Negative  42. Mucor Plumbeus Negative    43. Fusarium Moniliforme Negative    44. Aureobasidium Pullulans (pullulara) Negative    45. Rhizopus Oryzae Negative    46. Botrytis Cinera Negative    47. Epicoccum Nigrum Negative    48. Phoma Betae Negative    49. Dust Mite Mix Negative    50. Cat Hair 10,000 BAU/ml Negative    51.  Dog Epithelia Negative    52. Mixed Feathers Negative    53. Horse Epithelia Negative    54. Cockroach, German Negative    55. Tobacco Leaf Negative             Intradermal - 05/16/23 1029     Time Antigen Placed 1030    Allergen Manufacturer Greer    Location Arm    Number of Test 16    Control Negative    Bahia Negative    French [place] Territories Negative    Johnson Negative    7 Grass Negative    Ragweed Mix Negative    Weed Mix Negative     Tree Mix Negative    Mold 1 Negative    Mold 2 Negative    Mold 3 Negative    Mold 4 Negative    Mite Mix Negative    Cat Negative    Dog Negative    Cockroach Negative             Rhinitis (Hayfever) Overview  There are two types of rhinitis: allergic and non-allergic.  Allergic Rhinitis If you have allergic rhinitis, your immune system mistakenly identifies a typically harmless substance as an intruder. This substance is called an allergen. The immune system responds to the allergen by releasing histamine and chemical mediators that typically cause symptoms in the nose, throat, eyes, ears, skin and roof of the mouth.  Seasonal allergic rhinitis (hay fever) is most often caused by pollen carried in the air during different times of the year in different parts of the country.  Allergic rhinitis can also be triggered by common indoor allergens such as the dried skin flakes, urine and saliva found on pet dander, mold, droppings from dust mites and cockroach particles. This is called perennial allergic rhinitis, as symptoms typically occur year-round.  In addition to allergen triggers, symptoms may also occur from irritants such as smoke and strong odors, or to changes in the temperature and humidity of the air. This happens because allergic rhinitis causes inflammation in the nasal lining, which increases sensitivity to inhalants.  Many people with allergic rhinitis are prone to allergic conjunctivitis (eye allergy). In addition, allergic rhinitis can make symptoms of asthma worse for people who suffer from both conditions.  Nonallergic Rhinitis At least one out of three people with rhinitis symptoms do not have allergies. Nonallergic rhinitis usually afflicts adults and causes year-round symptoms, especially runny nose and nasal congestion. This condition differs from allergic rhinitis because the immune system is not involved.

## 2023-05-19 ENCOUNTER — Encounter: Payer: Self-pay | Admitting: Allergy & Immunology

## 2023-08-20 ENCOUNTER — Ambulatory Visit: Payer: Medicare PPO | Admitting: Family Medicine

## 2023-11-26 DIAGNOSIS — E559 Vitamin D deficiency, unspecified: Secondary | ICD-10-CM | POA: Diagnosis not present

## 2023-11-26 DIAGNOSIS — E785 Hyperlipidemia, unspecified: Secondary | ICD-10-CM | POA: Diagnosis not present

## 2023-11-27 DIAGNOSIS — Z0189 Encounter for other specified special examinations: Secondary | ICD-10-CM | POA: Diagnosis not present

## 2023-11-27 DIAGNOSIS — D72819 Decreased white blood cell count, unspecified: Secondary | ICD-10-CM | POA: Diagnosis not present

## 2023-12-03 DIAGNOSIS — I77811 Abdominal aortic ectasia: Secondary | ICD-10-CM | POA: Diagnosis not present

## 2023-12-03 DIAGNOSIS — Z1331 Encounter for screening for depression: Secondary | ICD-10-CM | POA: Diagnosis not present

## 2023-12-03 DIAGNOSIS — Z0000 Encounter for general adult medical examination without abnormal findings: Secondary | ICD-10-CM | POA: Diagnosis not present

## 2023-12-03 DIAGNOSIS — E785 Hyperlipidemia, unspecified: Secondary | ICD-10-CM | POA: Diagnosis not present

## 2023-12-03 DIAGNOSIS — Z803 Family history of malignant neoplasm of breast: Secondary | ICD-10-CM | POA: Diagnosis not present

## 2023-12-03 DIAGNOSIS — M8580 Other specified disorders of bone density and structure, unspecified site: Secondary | ICD-10-CM | POA: Diagnosis not present

## 2023-12-03 DIAGNOSIS — Z1339 Encounter for screening examination for other mental health and behavioral disorders: Secondary | ICD-10-CM | POA: Diagnosis not present

## 2023-12-03 DIAGNOSIS — F410 Panic disorder [episodic paroxysmal anxiety] without agoraphobia: Secondary | ICD-10-CM | POA: Diagnosis not present

## 2023-12-03 DIAGNOSIS — H9313 Tinnitus, bilateral: Secondary | ICD-10-CM | POA: Diagnosis not present

## 2023-12-03 DIAGNOSIS — E559 Vitamin D deficiency, unspecified: Secondary | ICD-10-CM | POA: Diagnosis not present

## 2023-12-03 DIAGNOSIS — Z780 Asymptomatic menopausal state: Secondary | ICD-10-CM | POA: Diagnosis not present

## 2023-12-09 DIAGNOSIS — N951 Menopausal and female climacteric states: Secondary | ICD-10-CM | POA: Diagnosis not present

## 2023-12-09 DIAGNOSIS — E785 Hyperlipidemia, unspecified: Secondary | ICD-10-CM | POA: Diagnosis not present

## 2023-12-09 DIAGNOSIS — E8941 Symptomatic postprocedural ovarian failure: Secondary | ICD-10-CM | POA: Diagnosis not present

## 2023-12-09 DIAGNOSIS — Z01411 Encounter for gynecological examination (general) (routine) with abnormal findings: Secondary | ICD-10-CM | POA: Diagnosis not present

## 2023-12-09 DIAGNOSIS — M8580 Other specified disorders of bone density and structure, unspecified site: Secondary | ICD-10-CM | POA: Diagnosis not present

## 2023-12-09 DIAGNOSIS — Z1231 Encounter for screening mammogram for malignant neoplasm of breast: Secondary | ICD-10-CM | POA: Diagnosis not present

## 2024-03-18 DIAGNOSIS — R399 Unspecified symptoms and signs involving the genitourinary system: Secondary | ICD-10-CM | POA: Diagnosis not present

## 2024-03-18 DIAGNOSIS — N390 Urinary tract infection, site not specified: Secondary | ICD-10-CM | POA: Diagnosis not present
